# Patient Record
Sex: Female | Born: 1975 | Race: White | Hispanic: No | Marital: Married | Smoking: Never smoker
Health system: Southern US, Community
[De-identification: ages and names within clinical notes are randomized; demographics above are authoritative.]

## PROBLEM LIST (undated history)

## (undated) DIAGNOSIS — O09529 Supervision of elderly multigravida, unspecified trimester: Secondary | ICD-10-CM

## (undated) HISTORY — PX: FEMORAL HERNIA REPAIR: SUR1179

## (undated) HISTORY — DX: Supervision of elderly multigravida, unspecified trimester: O09.529

## (undated) HISTORY — PX: KNEE ARTHROSCOPY: SUR90

## (undated) HISTORY — PX: ORIF FINGER / THUMB FRACTURE: SUR932

---

## 2006-04-03 ENCOUNTER — Ambulatory Visit: Payer: Self-pay | Admitting: Orthopaedic Surgery

## 2007-03-31 ENCOUNTER — Ambulatory Visit: Payer: Self-pay | Admitting: Sports Medicine

## 2008-06-14 ENCOUNTER — Emergency Department: Payer: Self-pay | Admitting: Unknown Physician Specialty

## 2008-11-03 ENCOUNTER — Emergency Department: Payer: Self-pay | Admitting: Internal Medicine

## 2009-02-22 ENCOUNTER — Emergency Department: Payer: Self-pay | Admitting: Emergency Medicine

## 2009-11-07 ENCOUNTER — Ambulatory Visit: Payer: Self-pay | Admitting: General Practice

## 2010-05-14 ENCOUNTER — Emergency Department: Payer: Self-pay | Admitting: Emergency Medicine

## 2010-12-15 ENCOUNTER — Emergency Department: Payer: Self-pay | Admitting: Emergency Medicine

## 2011-05-04 ENCOUNTER — Emergency Department: Payer: Self-pay | Admitting: *Deleted

## 2012-08-20 ENCOUNTER — Ambulatory Visit (INDEPENDENT_AMBULATORY_CARE_PROVIDER_SITE_OTHER): Payer: BC Managed Care – PPO | Admitting: Family Medicine

## 2012-08-20 VITALS — BP 103/64 | Ht 66.0 in | Wt 127.0 lb

## 2012-08-20 DIAGNOSIS — Z1151 Encounter for screening for human papillomavirus (HPV): Secondary | ICD-10-CM

## 2012-08-20 DIAGNOSIS — O09519 Supervision of elderly primigravida, unspecified trimester: Secondary | ICD-10-CM | POA: Insufficient documentation

## 2012-08-20 DIAGNOSIS — O09529 Supervision of elderly multigravida, unspecified trimester: Secondary | ICD-10-CM

## 2012-08-20 DIAGNOSIS — IMO0002 Reserved for concepts with insufficient information to code with codable children: Secondary | ICD-10-CM

## 2012-08-20 DIAGNOSIS — Z3400 Encounter for supervision of normal first pregnancy, unspecified trimester: Secondary | ICD-10-CM

## 2012-08-20 DIAGNOSIS — Z113 Encounter for screening for infections with a predominantly sexual mode of transmission: Secondary | ICD-10-CM

## 2012-08-20 DIAGNOSIS — Z124 Encounter for screening for malignant neoplasm of cervix: Secondary | ICD-10-CM

## 2012-08-20 DIAGNOSIS — Z3401 Encounter for supervision of normal first pregnancy, first trimester: Secondary | ICD-10-CM

## 2012-08-20 MED ORDER — PRENATAL VITAMINS 0.8 MG PO TABS: 1.0000 | ORAL_TABLET | Freq: Every day | ORAL | Status: DC

## 2012-08-20 NOTE — Patient Instructions (Signed)
Pregnancy - First Trimester During sexual intercourse, millions of sperm go into the vagina. Only 1 sperm will penetrate and fertilize the female egg while it is in the Fallopian tube. One week later, the fertilized egg implants into the wall of the uterus. An embryo begins to develop into a baby. At 6 to 8 weeks, the eyes and face are formed and the heartbeat can be seen on ultrasound. At the end of 12 weeks (first trimester), all the baby's organs are formed. Now that you are pregnant, you will want to do everything you can to have a healthy baby. Two of the most important things are to get good prenatal care and follow your caregiver's instructions. Prenatal care is all the medical care you receive before the baby's birth. It is given to prevent, find, and treat problems during the pregnancy and childbirth. PRENATAL EXAMS  During prenatal visits, your weight, blood pressure, and urine are checked. This is done to make sure you are healthy and progressing normally during the pregnancy.  A pregnant woman should gain 25 to 35 pounds during the pregnancy. However, if you are overweight or underweight, your caregiver will advise you regarding your weight.  Your caregiver will ask and answer questions for you.  Blood work, cervical cultures, other necessary tests, and a Pap test are done during your prenatal exams. These tests are done to check on your health and the probable health of your baby. Tests are strongly recommended and done for HIV with your permission. This is the virus that causes AIDS. These tests are done because medicines can be given to help prevent your baby from being born with this infection should you have been infected without knowing it. Blood work is also used to find out your blood type, previous infections, and follow your blood levels (hemoglobin).  Low hemoglobin (anemia) is common during pregnancy. Iron and vitamins are given to help prevent this. Later in the pregnancy,  blood tests for diabetes will be done along with any other tests if any problems develop.  You may need other tests to make sure you and the baby are doing well. CHANGES DURING THE FIRST TRIMESTER  Your body goes through many changes during pregnancy. They vary from person to person. Talk to your caregiver about changes you notice and are concerned about. Changes can include:  Your menstrual period stops.  The egg and sperm carry the genes that determine what you look like. Genes from you and your partner are forming a baby. The female genes determine whether the baby is a boy or a girl.  Your body increases in girth and you may feel bloated.  Feeling sick to your stomach (nauseous) and throwing up (vomiting). If the vomiting is uncontrollable, call your caregiver.  Your breasts will begin to enlarge and become tender.  Your nipples may stick out more and become darker.  The need to urinate more. Painful urination may mean you have a bladder infection.  Tiring easily.  Loss of appetite.  Cravings for certain kinds of food.  At first, you may gain or lose a couple of pounds.  You may have changes in your emotions from day to day (excited to be pregnant or concerned something may go wrong with the pregnancy and baby).  You may have more vivid and strange dreams. HOME CARE INSTRUCTIONS   It is very important to avoid all smoking, alcohol and non-prescribed drugs during your pregnancy. These affect the formation and growth of the baby.   Avoid chemicals while pregnant to ensure the delivery of a healthy infant.  Start your prenatal visits by the 12th week of pregnancy. They are usually scheduled monthly at first, then more often in the last 2 months before delivery. Keep your caregiver's appointments. Follow your caregiver's instructions regarding medicine use, blood and lab tests, exercise, and diet.  During pregnancy, you are providing food for you and your baby. Eat regular,  well-balanced meals. Choose foods such as meat, fish, milk and other low fat dairy products, vegetables, fruits, and whole-grain breads and cereals. Your caregiver will tell you of the ideal weight gain.  You can help morning sickness by keeping soda crackers at the bedside. Eat a couple before arising in the morning. You may want to use the crackers without salt on them.  Eating 4 to 5 small meals rather than 3 large meals a day also may help the nausea and vomiting.  Drinking liquids between meals instead of during meals also seems to help nausea and vomiting.  A physical sexual relationship may be continued throughout pregnancy if there are no other problems. Problems may be early (premature) leaking of amniotic fluid from the membranes, vaginal bleeding, or belly (abdominal) pain.  Exercise regularly if there are no restrictions. Check with your caregiver or physical therapist if you are unsure of the safety of some of your exercises. Greater weight gain will occur in the last 2 trimesters of pregnancy. Exercising will help:  Control your weight.  Keep you in shape.  Prepare you for labor and delivery.  Help you lose your pregnancy weight after you deliver your baby.  Wear a good support or jogging bra for breast tenderness during pregnancy. This may help if worn during sleep too.  Ask when prenatal classes are available. Begin classes when they are offered.  Do not use hot tubs, steam rooms, or saunas.  Wear your seat belt when driving. This protects you and your baby if you are in an accident.  Avoid raw meat, uncooked cheese, cat litter boxes, and soil used by cats throughout the pregnancy. These carry germs that can cause birth defects in the baby.  The first trimester is a good time to visit your dentist for your dental health. Getting your teeth cleaned is okay. Use a softer toothbrush and brush gently during pregnancy.  Ask for help if you have financial, counseling, or  nutritional needs during pregnancy. Your caregiver will be able to offer counseling for these needs as well as refer you for other special needs.  Do not take any medicines or herbs unless told by your caregiver.  Inform your caregiver if there is any mental or physical domestic violence.  Make a list of emergency phone numbers of family, friends, hospital, and police and fire departments.  Write down your questions. Take them to your prenatal visit.  Do not douche.  Do not cross your legs.  If you have to stand for long periods of time, rotate you feet or take small steps in a circle.  You may have more vaginal secretions that may require a sanitary pad. Do not use tampons or scented sanitary pads. MEDICINES AND DRUG USE IN PREGNANCY  Take prenatal vitamins as directed. The vitamin should contain 1 milligram of folic acid. Keep all vitamins out of reach of children. Only a couple vitamins or tablets containing iron may be fatal to a baby or young child when ingested.  Avoid use of all medicines, including herbs, over-the-counter medicines, not   prescribed or suggested by your caregiver. Only take over-the-counter or prescription medicines for pain, discomfort, or fever as directed by your caregiver. Do not use aspirin, ibuprofen, or naproxen unless directed by your caregiver.  Let your caregiver also know about herbs you may be using.  Alcohol is related to a number of birth defects. This includes fetal alcohol syndrome. All alcohol, in any form, should be avoided completely. Smoking will cause low birth rate and premature babies.  Street or illegal drugs are very harmful to the baby. They are absolutely forbidden. A baby born to an addicted mother will be addicted at birth. The baby will go through the same withdrawal an adult does.  Let your caregiver know about any medicines that you have to take and for what reason you take them. SEEK MEDICAL CARE IF:  You have any concerns or  worries during your pregnancy. It is better to call with your questions if you feel they cannot wait, rather than worry about them. SEEK IMMEDIATE MEDICAL CARE IF:   An unexplained oral temperature above 102 F (38.9 C) develops, or as your caregiver suggests.  You have leaking of fluid from the vagina (birth canal). If leaking membranes are suspected, take your temperature and inform your caregiver of this when you call.  There is vaginal spotting or bleeding. Notify your caregiver of the amount and how many pads are used.  You develop a bad smelling vaginal discharge with a change in the color.  You continue to feel sick to your stomach (nauseated) and have no relief from remedies suggested. You vomit blood or coffee ground-like materials.  You lose more than 2 pounds of weight in 1 week.  You gain more than 2 pounds of weight in 1 week and you notice swelling of your face, hands, feet, or legs.  You gain 5 pounds or more in 1 week (even if you do not have swelling of your hands, face, legs, or feet).  You get exposed to German measles and have never had them.  You are exposed to fifth disease or chickenpox.  You develop belly (abdominal) pain. Round ligament discomfort is a common non-cancerous (benign) cause of abdominal pain in pregnancy. Your caregiver still must evaluate this.  You develop headache, fever, diarrhea, pain with urination, or shortness of breath.  You fall or are in a car accident or have any kind of trauma.  There is mental or physical violence in your home. Document Released: 01/15/2001 Document Revised: 10/16/2011 Document Reviewed: 07/19/2008 [employer] Patient Information 2014 [employer].  Breastfeeding A change in hormones during your pregnancy causes growth of your breast tissue and an increase in number and size of milk ducts. The hormone prolactin allows proteins, sugars, and fats from your blood supply to make breast milk in your milk-producing  glands. The hormone progesterone prevents breast milk from being released before the birth of your baby. After the birth of your baby, your progesterone level decreases allowing breast milk to be released. Thoughts of your baby, as well as his or her sucking or crying, can stimulate the release of milk from the milk-producing glands. Deciding to breastfeed (nurse) is one of the best choices you can make for you and your baby. The information that follows gives a brief review of the benefits, as well as other important skills to know about breastfeeding. BENEFITS OF BREASTFEEDING For your baby  The first milk (colostrum) helps your baby's digestive system function better.   There are antibodies in   your milk that help your baby fight off infections.   Your baby has a lower incidence of asthma, allergies, and sudden infant death syndrome (SIDS).   The nutrients in breast milk are better for your baby than infant formulas.  Breast milk improves your baby's brain development.   Your baby will have less gas, colic, and constipation.  Your baby is less likely to develop other conditions, such as childhood obesity, asthma, or diabetes mellitus. For you  Breastfeeding helps develop a very special bond between you and your baby.   Breastfeeding is convenient, always available at the correct temperature, and costs nothing.   Breastfeeding helps to burn calories and helps you lose the weight gained during pregnancy.   Breastfeeding makes your uterus contract back down to normal size faster and slows bleeding following delivery.   Breastfeeding mothers have a lower risk of developing osteoporosis or breast or ovarian cancer later in life.  BREASTFEEDING FREQUENCY  A healthy, full-term baby may breastfeed as often as every hour or space his or her feedings to every 3 hours. Breastfeeding frequency will vary from baby to baby.   Newborns should be fed no less than every 2 3 hours during  the day and every 4 5 hours during the night. You should breastfeed a minimum of 8 feedings in a 24 hour period.  Awaken your baby to breastfeed if it has been 3 4 hours since the last feeding.  Breastfeed when you feel the need to reduce the fullness of your breasts or when your newborn shows signs of hunger. Signs that your baby may be hungry include:  Increased alertness or activity.  Stretching.  Movement of the head from side to side.  Movement of the head and opening of the mouth when the corner of the mouth or cheek is stroked (rooting).  Increased sucking sounds, smacking lips, cooing, sighing, or squeaking.  Hand-to-mouth movements.  Increased sucking of fingers or hands.  Fussing.  Intermittent crying.  Signs of extreme hunger will require calming and consoling before you try to feed your baby. Signs of extreme hunger may include:  Restlessness.  A loud, strong cry.  Screaming.  Frequent feeding will help you make more milk and will help prevent problems, such as sore nipples and engorgement of the breasts.  BREASTFEEDING   Whether lying down or sitting, be sure that the baby's abdomen is facing your abdomen.   Support your breast with 4 fingers under your breast and your thumb above your nipple. Make sure your fingers are well away from your nipple and your baby's mouth.   Stroke your baby's lips gently with your finger or nipple.   When your baby's mouth is open wide enough, place all of your nipple and as much of the colored area around your nipple (areola) as possible into your baby's mouth.  More areola should be visible above his or her upper lip than below his or her lower lip.  Your baby's tongue should be between his or her lower gum and your breast.  Ensure that your baby's mouth is correctly positioned around the nipple (latched). Your baby's lips should create a seal on your breast.  Signs that your baby has effectively latched onto your  nipple include:  Tugging or sucking without pain.  Swallowing heard between sucks.  Absent click or smacking sound.  Muscle movement above and in front of his or her ears with sucking.  Your baby must suck about 2 3 minutes in   order to get your milk. Allow your baby to feed on each breast as long as he or she wants. Nurse your baby until he or she unlatches or falls asleep at the first breast, then offer the second breast.  Signs that your baby is full and satisfied include:  A gradual decrease in the number of sucks or complete cessation of sucking.  Falling asleep.  Extension or relaxation of his or her body.  Retention of a small amount of milk in his or her mouth.  Letting go of your breast by himself or herself.  Signs of effective breastfeeding in you include:  Breasts that have increased firmness, weight, and size prior to feeding.  Breasts that are softer after nursing.  Increased milk volume, as well as a change in milk consistency and color by the 5th day of breastfeeding.  Breast fullness relieved by breastfeeding.  Nipples are not sore, cracked, or bleeding.  If needed, break the suction by putting your finger into the corner of your baby's mouth and sliding your finger between his or her gums. Then, remove your breast from his or her mouth.  It is common for babies to spit up a small amount after a feeding.  Babies often swallow air during feeding. This can make babies fussy. Burping your baby between breasts can help with this.  Vitamin D supplements are recommended for babies who get only breast milk.  Avoid using a pacifier during your baby's first 4 6 weeks.  Avoid supplemental feedings of water, formula, or juice in place of breastfeeding. Breast milk is all the food your baby needs. It is not necessary for your baby to have water or formula. Your breasts will make more milk if supplemental feedings are avoided during the early weeks. HOW TO TELL  WHETHER YOUR BABY IS GETTING ENOUGH BREAST MILK Wondering whether or not your baby is getting enough milk is a common concern among mothers. You can be assured that your baby is getting enough milk if:   Your baby is actively sucking and you hear swallowing.   Your baby seems relaxed and satisfied after a feeding.   Your baby nurses at least 8 12 times in a 24 hour time period.  During the first 3 5 days of age:  Your baby is wetting at least 3 5 diapers in a 24 hour period. The urine should be clear and pale yellow.  Your baby is having at least 3 4 stools in a 24 hour period. The stool should be soft and yellow.  At 5 7 days of age, your baby is having at least 3 6 stools in a 24 hour period. The stool should be seedy and yellow by 5 days of age.  Your baby has a weight loss less than 7 10% during the first 3 days of age.  Your baby does not lose weight after 3 7 days of age.  Your baby gains 4 7 ounces each week after he or she is 4 days of age.  Your baby gains weight by 5 days of age and is back to birth weight within 2 weeks. ENGORGEMENT In the first week after your baby is born, you may experience extremely full breasts (engorgement). When engorged, your breasts may feel heavy, warm, or tender to the touch. Engorgement peaks within 24 48 hours after delivery of your baby.  Engorgement may be reduced by:  Continuing to breastfeed.  Increasing the frequency of breastfeeding.  Taking warm showers or   applying warm, moist heat to your breasts just before each feeding. This increases circulation and helps the milk flow.   Gently massaging your breast before and during the feedings. With your fingertips, massage from your chest wall towards your nipple in a circular motion.   Ensuring that your baby empties at least one breast at every feeding. It also helps to start the next feeding on the opposite breast.   Expressing breast milk by hand or by using a breast pump to  empty the breasts if your baby is sleepy, or not nursing well. You may also want to express milk if you are returning to work oryou feel you are getting engorged.  Ensuring your baby is latched on and positioned properly while breastfeeding. If you follow these suggestions, your engorgement should improve in 24 48 hours. If you are still experiencing difficulty, call your lactation consultant or caregiver.  CARING FOR YOURSELF Take care of your breasts.  Bathe or shower daily.   Avoid using soap on your nipples.   Wear a supportive bra. Avoid wearing underwire style bras.  Air dry your nipples for a 3 4minutes after each feeding.   Use only cotton bra pads to absorb breast milk leakage. Leaking of breast milk between feedings is normal.   Use only pure lanolin on your nipples after nursing. You do not need to wash it off before feeding your baby again. Another option is to express a few drops of breast milk and gently massage that milk into your nipples.  Continue breast self-awareness checks. Take care of yourself.  Eat healthy foods. Alternate 3 meals with 3 snacks.  Avoid foods that you notice affect your baby in a bad way.  Drink milk, fruit juice, and water to satisfy your thirst (about 8 glasses a day).   Rest often, relax, and take your prenatal vitamins to prevent fatigue, stress, and anemia.  Avoid chewing and smoking tobacco.  Avoid alcohol and drug use.  Take over-the-counter and prescribed medicine only as directed by your caregiver or pharmacist. You should always check with your caregiver or pharmacist before taking any new medicine, vitamin, or herbal supplement.  Know that pregnancy is possible while breastfeeding. If desired, talk to your caregiver about family planning and safe birth control methods that may be used while breastfeeding. SEEK MEDICAL CARE IF:   You feel like you want to stop breastfeeding or have become frustrated with  breastfeeding.  You have painful breasts or nipples.  Your nipples are cracked or bleeding.  Your breasts are red, tender, or warm.  You have a swollen area on either breast.  You have a fever or chills.  You have nausea or vomiting.  You have drainage from your nipples.  Your breasts do not become full before feedings by the 5th day after delivery.  You feel sad and depressed.  Your baby is too sleepy to eat well.  Your baby is having trouble sleeping.   Your baby is wetting less than 3 diapers in a 24 hour period.  Your baby has less than 3 stools in a 24 hour period.  Your baby's skin or the white part of his or her eyes becomes more yellow.   Your baby is not gaining weight by 5 days of age. MAKE SURE YOU:   Understand these instructions.  Will watch your condition.  Will get help right away if you are not doing well or get worse. Document Released: 01/21/2005 Document Revised: 10/16/2011 Document Reviewed:   08/28/2011 [employer] Patient Information 2014 [employer].  

## 2012-08-20 NOTE — Progress Notes (Signed)
   Subjective:    Margaret Rosario is a G1P0 [redacted]w[redacted]d being seen today for her first obstetrical visit.  Her obstetrical history is significant for AMA. Patient does intend to breast feed. Pregnancy history fully reviewed.  Patient reports fatigue and nausea.  Filed Vitals:   08/20/12 1017 08/20/12 1020  BP: 103/64   Height:  5\' 6"  (1.676 m)  Weight: 127 lb (57.607 kg)     HISTORY: OB History   Grav Para Term Preterm Abortions TAB SAB Ect Mult Living   1              # Outc Date GA Lbr Len/2nd Wgt Sex Del Anes PTL Lv   1 CUR              No past medical history on file. Past Surgical History  Procedure Laterality Date  . Knee arthroscopy Left   . Femoral hernia repair    . Orif finger / thumb fracture     Family History  Problem Relation Age of Onset  . Graves' disease Brother   . Cancer Maternal Grandfather     lung     Exam    Uterus:     Pelvic Exam:    Perineum: Normal Perineum   Vulva: Bartholin's, Urethra, Skene's normal   Vagina:  normal mucosa, normal discharge   Cervix: no cervical motion tenderness, no lesions and nulliparous appearance   Adnexa: normal adnexa   Bony Pelvis: average  System: Breast:  normal appearance, no masses or tenderness   Skin: normal coloration and turgor, no rashes    Neurologic: oriented   Extremities: normal strength, tone, and muscle mass   HEENT sclera clear, anicteric   Mouth/Teeth mucous membranes moist, pharynx normal without lesions   Neck supple   Cardiovascular: regular rate and rhythm, no murmurs or gallops   Respiratory:  appears well, vitals normal, no respiratory distress, acyanotic, normal RR, ear and throat exam is normal, neck free of mass or lymphadenopathy, chest clear, no wheezing, crepitations, rhonchi, normal symmetric air entry   Abdomen: soft, non-tender; bowel sounds normal; no masses,  no organomegaly      Assessment:    Pregnancy: G1P0 Patient Active Problem List   Diagnosis Date Noted  .  Supervision of normal first pregnancy 08/20/2012  . Advanced maternal age in pregnancy in first trimester 08/20/2012        Plan:     Initial labs drawn. Prenatal vitamins. Problem list reviewed and updated. Genetic Screening discussed First Screen: undecided for genetics referral for AMA  Ultrasound discussed; fetal survey: discussed.  Follow up in 4 weeks. New ob labs and pap done today.     Winn Muehl S 08/20/2012

## 2012-08-20 NOTE — Progress Notes (Signed)
P - 63 - Pt states breast have been more tender than usual, she has experienced nausea and fatigue

## 2012-08-21 LAB — OBSTETRIC PANEL
Antibody Screen: NEGATIVE
Basophils Absolute: 0 10*3/uL (ref 0.0–0.1)
Basophils Relative: 0 % (ref 0–1)
Eosinophils Absolute: 0.1 10*3/uL (ref 0.0–0.7)
Hepatitis B Surface Ag: NEGATIVE
MCH: 30 pg (ref 26.0–34.0)
MCHC: 32.9 g/dL (ref 30.0–36.0)
Monocytes Absolute: 0.6 10*3/uL (ref 0.1–1.0)
Neutro Abs: 4.3 10*3/uL (ref 1.7–7.7)
Neutrophils Relative %: 68 % (ref 43–77)
RDW: 12.5 % (ref 11.5–15.5)

## 2012-08-22 LAB — CULTURE, OB URINE: Colony Count: NO GROWTH

## 2012-08-24 LAB — HEMOGLOBINOPATHY EVALUATION
Hgb A2 Quant: 2.6 % (ref 2.2–3.2)
Hgb A: 97.4 % (ref 96.8–97.8)
Hgb F Quant: 0 % (ref 0.0–2.0)

## 2012-08-27 ENCOUNTER — Telehealth: Payer: Self-pay

## 2012-08-27 NOTE — Telephone Encounter (Signed)
Patient is having bad morning sickness I called in Zofran 4mg  tab #30 with 3 refills to her pharmacy [facility] on [address], please addendum this to her meds thanks Dr. Shawnie Pons.

## 2012-08-31 MED ORDER — ONDANSETRON HCL 4 MG PO TABS: 4.0000 mg | ORAL_TABLET | Freq: Three times a day (TID) | ORAL | Status: DC | PRN

## 2012-09-17 ENCOUNTER — Ambulatory Visit (INDEPENDENT_AMBULATORY_CARE_PROVIDER_SITE_OTHER): Payer: BC Managed Care – PPO | Admitting: Obstetrics & Gynecology

## 2012-09-17 VITALS — BP 107/71 | Wt 124.0 lb

## 2012-09-17 DIAGNOSIS — O09529 Supervision of elderly multigravida, unspecified trimester: Secondary | ICD-10-CM

## 2012-09-17 DIAGNOSIS — IMO0002 Reserved for concepts with insufficient information to code with codable children: Secondary | ICD-10-CM

## 2012-09-17 DIAGNOSIS — Z3480 Encounter for supervision of other normal pregnancy, unspecified trimester: Secondary | ICD-10-CM

## 2012-09-17 NOTE — Progress Notes (Signed)
Still undecided about fetal screening, will decide and let us know. Nausea is better now on Zofran.  No other complaints or concerns.  Routine obstetric precautions reviewed.

## 2012-09-17 NOTE — Progress Notes (Signed)
P=79 

## 2012-09-17 NOTE — Patient Instructions (Signed)
[facility] noninvasive prenatal testing  First trimeter screening and nuchal translucency Quad screen in second trimester  Pregnancy - First Trimester During sexual intercourse, millions of sperm go into the vagina. Only 1 sperm will penetrate and fertilize the female egg while it is in the Fallopian tube. One week later, the fertilized egg implants into the wall of the uterus. An embryo begins to develop into a baby. At 6 to 8 weeks, the eyes and face are formed and the heartbeat can be seen on ultrasound. At the end of 12 weeks (first trimester), all the baby's organs are formed. Now that you are pregnant, you will want to do everything you can to have a healthy baby. Two of the most important things are to get good prenatal care and follow your caregiver's instructions. Prenatal care is all the medical care you receive before the baby's birth. It is given to prevent, find, and treat problems during the pregnancy and childbirth. PRENATAL EXAMS  During prenatal visits, your weight, blood pressure, and urine are checked. This is done to make sure you are healthy and progressing normally during the pregnancy.  A pregnant woman should gain 25 to 35 pounds during the pregnancy. However, if you are overweight or underweight, your caregiver will advise you regarding your weight.  Your caregiver will ask and answer questions for you.  Blood work, cervical cultures, other necessary tests, and a Pap test are done during your prenatal exams. These tests are done to check on your health and the probable health of your baby. Tests are strongly recommended and done for HIV with your permission. This is the virus that causes AIDS. These tests are done because medicines can be given to help prevent your baby from being born with this infection should you have been infected without knowing it. Blood work is also used to find out your blood type, previous infections, and follow your blood levels (hemoglobin).  Low  hemoglobin (anemia) is common during pregnancy. Iron and vitamins are given to help prevent this. Later in the pregnancy, blood tests for diabetes will be done along with any other tests if any problems develop.  You may need other tests to make sure you and the baby are doing well. CHANGES DURING THE FIRST TRIMESTER  Your body goes through many changes during pregnancy. They vary from person to person. Talk to your caregiver about changes you notice and are concerned about. Changes can include:  Your menstrual period stops.  The egg and sperm carry the genes that determine what you look like. Genes from you and your partner are forming a baby. The female genes determine whether the baby is a boy or a girl.  Your body increases in girth and you may feel bloated.  Feeling sick to your stomach (nauseous) and throwing up (vomiting). If the vomiting is uncontrollable, call your caregiver.  Your breasts will begin to enlarge and become tender.  Your nipples may stick out more and become darker.  The need to urinate more. Painful urination may mean you have a bladder infection.  Tiring easily.  Loss of appetite.  Cravings for certain kinds of food.  At first, you may gain or lose a couple of pounds.  You may have changes in your emotions from day to day (excited to be pregnant or concerned something may go wrong with the pregnancy and baby).  You may have more vivid and strange dreams. HOME CARE INSTRUCTIONS   It is very important to avoid all  smoking, alcohol and non-prescribed drugs during your pregnancy. These affect the formation and growth of the baby. Avoid chemicals while pregnant to ensure the delivery of a healthy infant.  Start your prenatal visits by the 12th week of pregnancy. They are usually scheduled monthly at first, then more often in the last 2 months before delivery. Keep your caregiver's appointments. Follow your caregiver's instructions regarding medicine use, blood  and lab tests, exercise, and diet.  During pregnancy, you are providing food for you and your baby. Eat regular, well-balanced meals. Choose foods such as meat, fish, milk and other low fat dairy products, vegetables, fruits, and whole-grain breads and cereals. Your caregiver will tell you of the ideal weight gain.  You can help morning sickness by keeping soda crackers at the bedside. Eat a couple before arising in the morning. You may want to use the crackers without salt on them.  Eating 4 to 5 small meals rather than 3 large meals a day also may help the nausea and vomiting.  Drinking liquids between meals instead of during meals also seems to help nausea and vomiting.  A physical sexual relationship may be continued throughout pregnancy if there are no other problems. Problems may be early (premature) leaking of amniotic fluid from the membranes, vaginal bleeding, or belly (abdominal) pain.  Exercise regularly if there are no restrictions. Check with your caregiver or physical therapist if you are unsure of the safety of some of your exercises. Greater weight gain will occur in the last 2 trimesters of pregnancy. Exercising will help:  Control your weight.  Keep you in shape.  Prepare you for labor and delivery.  Help you lose your pregnancy weight after you deliver your baby.  Wear a good support or jogging bra for breast tenderness during pregnancy. This may help if worn during sleep too.  Ask when prenatal classes are available. Begin classes when they are offered.  Do not use hot tubs, steam rooms, or saunas.  Wear your seat belt when driving. This protects you and your baby if you are in an accident.  Avoid raw meat, uncooked cheese, cat litter boxes, and soil used by cats throughout the pregnancy. These carry germs that can cause birth defects in the baby.  The first trimester is a good time to visit your dentist for your dental health. Getting your teeth cleaned is okay.  Use a softer toothbrush and brush gently during pregnancy.  Ask for help if you have financial, counseling, or nutritional needs during pregnancy. Your caregiver will be able to offer counseling for these needs as well as refer you for other special needs.  Do not take any medicines or herbs unless told by your caregiver.  Inform your caregiver if there is any mental or physical domestic violence.  Make a list of emergency phone numbers of family, friends, hospital, and police and fire departments.  Write down your questions. Take them to your prenatal visit.  Do not douche.  Do not cross your legs.  If you have to stand for long periods of time, rotate you feet or take small steps in a circle.  You may have more vaginal secretions that may require a sanitary pad. Do not use tampons or scented sanitary pads. MEDICINES AND DRUG USE IN PREGNANCY  Take prenatal vitamins as directed. The vitamin should contain 1 milligram of folic acid. Keep all vitamins out of reach of children. Only a couple vitamins or tablets containing iron may be fatal to a  baby or young child when ingested.  Avoid use of all medicines, including herbs, over-the-counter medicines, not prescribed or suggested by your caregiver. Only take over-the-counter or prescription medicines for pain, discomfort, or fever as directed by your caregiver. Do not use aspirin, ibuprofen, or naproxen unless directed by your caregiver.  Let your caregiver also know about herbs you may be using.  Alcohol is related to a number of birth defects. This includes fetal alcohol syndrome. All alcohol, in any form, should be avoided completely. Smoking will cause low birth rate and premature babies.  Street or illegal drugs are very harmful to the baby. They are absolutely forbidden. A baby born to an addicted mother will be addicted at birth. The baby will go through the same withdrawal an adult does.  Let your caregiver know about any  medicines that you have to take and for what reason you take them. SEEK MEDICAL CARE IF:  You have any concerns or worries during your pregnancy. It is better to call with your questions if you feel they cannot wait, rather than worry about them. SEEK IMMEDIATE MEDICAL CARE IF:   An unexplained oral temperature above 102 F (38.9 C) develops, or as your caregiver suggests.  You have leaking of fluid from the vagina (birth canal). If leaking membranes are suspected, take your temperature and inform your caregiver of this when you call.  There is vaginal spotting or bleeding. Notify your caregiver of the amount and how many pads are used.  You develop a bad smelling vaginal discharge with a change in the color.  You continue to feel sick to your stomach (nauseated) and have no relief from remedies suggested. You vomit blood or coffee ground-like materials.  You lose more than 2 pounds of weight in 1 week.  You gain more than 2 pounds of weight in 1 week and you notice swelling of your face, hands, feet, or legs.  You gain 5 pounds or more in 1 week (even if you do not have swelling of your hands, face, legs, or feet).  You get exposed to [place] measles and have never had them.  You are exposed to fifth disease or chickenpox.  You develop belly (abdominal) pain. Round ligament discomfort is a common non-cancerous (benign) cause of abdominal pain in pregnancy. Your caregiver still must evaluate this.  You develop headache, fever, diarrhea, pain with urination, or shortness of breath.  You fall or are in a car accident or have any kind of trauma.  There is mental or physical violence in your home. Document Released: 01/15/2001 Document Revised: 10/16/2011 Document Reviewed: 07/19/2008 [facility] Patient Information 2014 [place], [place].

## 2012-09-28 ENCOUNTER — Telehealth: Payer: Self-pay | Admitting: *Deleted

## 2012-09-28 MED ORDER — PROMETHAZINE HCL 25 MG PO TABS: 25.0000 mg | ORAL_TABLET | Freq: Four times a day (QID) | ORAL | Status: DC | PRN

## 2012-09-28 NOTE — Telephone Encounter (Signed)
Would like to try phenergan instead of the zofran due to zofran is causing constipation issues.

## 2012-10-15 ENCOUNTER — Ambulatory Visit (INDEPENDENT_AMBULATORY_CARE_PROVIDER_SITE_OTHER): Payer: BC Managed Care – PPO | Admitting: Obstetrics and Gynecology

## 2012-10-15 ENCOUNTER — Encounter: Payer: Self-pay | Admitting: Obstetrics and Gynecology

## 2012-10-15 VITALS — BP 110/71 | Wt 127.0 lb

## 2012-10-15 DIAGNOSIS — IMO0002 Reserved for concepts with insufficient information to code with codable children: Secondary | ICD-10-CM

## 2012-10-15 DIAGNOSIS — Z3480 Encounter for supervision of other normal pregnancy, unspecified trimester: Secondary | ICD-10-CM

## 2012-10-15 DIAGNOSIS — O09529 Supervision of elderly multigravida, unspecified trimester: Secondary | ICD-10-CM

## 2012-10-15 NOTE — Progress Notes (Signed)
[id number] 

## 2012-10-15 NOTE — Progress Notes (Signed)
Patient doing well, feeling better. Decided on NIPS. Anatomy ultrasound scheduled for week of 10/13

## 2012-10-16 NOTE — Addendum Note (Signed)
Addended by: Barbara Cower on: 10/16/2012 11:16 AM   Modules accepted: Orders

## 2012-10-29 ENCOUNTER — Telehealth: Payer: Self-pay | Admitting: *Deleted

## 2012-10-29 NOTE — Telephone Encounter (Signed)
Spoke to patient and she was given results of her [facility] testing.  All testing came back "Low Risk"  Patient and her husband do not wish to know the sex of the baby.

## 2012-11-11 ENCOUNTER — Ambulatory Visit (INDEPENDENT_AMBULATORY_CARE_PROVIDER_SITE_OTHER): Payer: BC Managed Care – PPO | Admitting: Family Medicine

## 2012-11-11 ENCOUNTER — Encounter: Payer: Self-pay | Admitting: Family Medicine

## 2012-11-11 VITALS — BP 112/64 | Wt 132.0 lb

## 2012-11-11 DIAGNOSIS — O09529 Supervision of elderly multigravida, unspecified trimester: Secondary | ICD-10-CM

## 2012-11-11 DIAGNOSIS — Z3480 Encounter for supervision of other normal pregnancy, unspecified trimester: Secondary | ICD-10-CM

## 2012-11-11 DIAGNOSIS — IMO0002 Reserved for concepts with insufficient information to code with codable children: Secondary | ICD-10-CM

## 2012-11-11 NOTE — Patient Instructions (Signed)
Pregnancy - Second Trimester The second trimester of pregnancy (3 to 6 months) is a period of rapid growth for you and your baby. At the end of the sixth month, your baby is about 9 inches long and weighs 1 1/2 pounds. You will begin to feel the baby move between 18 and 20 weeks of the pregnancy. This is called quickening. Weight gain is faster. A clear fluid (colostrum) may leak out of your breasts. You may feel small contractions of the womb (uterus). This is known as false labor or Braxton-Hicks contractions. This is like a practice for labor when the baby is ready to be born. Usually, the problems with morning sickness have usually passed by the end of your first trimester. Some women develop small dark blotches (called cholasma, mask of pregnancy) on their face that usually goes away after the baby is born. Exposure to the sun makes the blotches worse. Acne may also develop in some pregnant women and pregnant women who have acne, may find that it goes away. PRENATAL EXAMS  Blood work may continue to be done during prenatal exams. These tests are done to check on your health and the probable health of your baby. Blood work is used to follow your blood levels (hemoglobin). Anemia (low hemoglobin) is common during pregnancy. Iron and vitamins are given to help prevent this. You will also be checked for diabetes between 24 and 28 weeks of the pregnancy. Some of the previous blood tests may be repeated.  The size of the uterus is measured during each visit. This is to make sure that the baby is continuing to grow properly according to the dates of the pregnancy.  Your blood pressure is checked every prenatal visit. This is to make sure you are not getting toxemia.  Your urine is checked to make sure you do not have an infection, diabetes or protein in the urine.  Your weight is checked often to make sure gains are happening at the suggested rate. This is to ensure that both you and your baby are  growing normally.  Sometimes, an ultrasound is performed to confirm the proper growth and development of the baby. This is a test which bounces harmless sound waves off the baby so your caregiver can more accurately determine due dates. Sometimes, a test is done on the amniotic fluid surrounding the baby. This test is called an amniocentesis. The amniotic fluid is obtained by sticking a needle into the belly (abdomen). This is done to check the chromosomes in instances where there is a concern about possible genetic problems with the baby. It is also sometimes done near the end of pregnancy if an early delivery is required. In this case, it is done to help make sure the baby's lungs are mature enough for the baby to live outside of the womb. CHANGES OCCURING IN THE SECOND TRIMESTER OF PREGNANCY Your body goes through many changes during pregnancy. They vary from person to person. Talk to your caregiver about changes you notice that you are concerned about.  During the second trimester, you will likely have an increase in your appetite. It is normal to have cravings for certain foods. This varies from person to person and pregnancy to pregnancy.  Your lower abdomen will begin to bulge.  You may have to urinate more often because the uterus and baby are pressing on your bladder. It is also common to get more bladder infections during pregnancy. You can help this by drinking lots of fluids   and emptying your bladder before and after intercourse.  You may begin to get stretch marks on your hips, abdomen, and breasts. These are normal changes in the body during pregnancy. There are no exercises or medicines to take that prevent this change.  You may begin to develop swollen and bulging veins (varicose veins) in your legs. Wearing support hose, elevating your feet for 15 minutes, 3 to 4 times a day and limiting salt in your diet helps lessen the problem.  Heartburn may develop as the uterus grows and  pushes up against the stomach. Antacids recommended by your caregiver helps with this problem. Also, eating smaller meals 4 to 5 times a day helps.  Constipation can be treated with a stool softener or adding bulk to your diet. Drinking lots of fluids, and eating vegetables, fruits, and whole grains are helpful.  Exercising is also helpful. If you have been very active up until your pregnancy, most of these activities can be continued during your pregnancy. If you have been less active, it is helpful to start an exercise program such as walking.  Hemorrhoids may develop at the end of the second trimester. Warm sitz baths and hemorrhoid cream recommended by your caregiver helps hemorrhoid problems.  Backaches may develop during this time of your pregnancy. Avoid heavy lifting, wear low heal shoes, and practice good posture to help with backache problems.  Some pregnant women develop tingling and numbness of their hand and fingers because of swelling and tightening of ligaments in the wrist (carpel tunnel syndrome). This goes away after the baby is born.  As your breasts enlarge, you may have to get a bigger bra. Get a comfortable, cotton, support bra. Do not get a nursing bra until the last month of the pregnancy if you will be nursing the baby.  You may get a dark line from your belly button to the pubic area called the linea nigra.  You may develop rosy cheeks because of increase blood flow to the face.  You may develop spider looking lines of the face, neck, arms, and chest. These go away after the baby is born. HOME CARE INSTRUCTIONS   It is extremely important to avoid all smoking, herbs, alcohol, and unprescribed drugs during your pregnancy. These chemicals affect the formation and growth of the baby. Avoid these chemicals throughout the pregnancy to ensure the delivery of a healthy infant.  Most of your home care instructions are the same as suggested for the first trimester of your  pregnancy. Keep your caregiver's appointments. Follow your caregiver's instructions regarding medicine use, exercise, and diet.  During pregnancy, you are providing food for you and your baby. Continue to eat regular, well-balanced meals. Choose foods such as meat, fish, milk and other low fat dairy products, vegetables, fruits, and whole-grain breads and cereals. Your caregiver will tell you of the ideal weight gain.  A physical sexual relationship may be continued up until near the end of pregnancy if there are no other problems. Problems could include early (premature) leaking of amniotic fluid from the membranes, vaginal bleeding, abdominal pain, or other medical or pregnancy problems.  Exercise regularly if there are no restrictions. Check with your caregiver if you are unsure of the safety of some of your exercises. The greatest weight gain will occur in the last 2 trimesters of pregnancy. Exercise will help you:  Control your weight.  Get you in shape for labor and delivery.  Lose weight after you have the baby.  Wear   a good support or jogging bra for breast tenderness during pregnancy. This may help if worn during sleep. Pads or tissues may be used in the bra if you are leaking colostrum.  Do not use hot tubs, steam rooms or saunas throughout the pregnancy.  Wear your seat belt at all times when driving. This protects you and your baby if you are in an accident.  Avoid raw meat, uncooked cheese, cat litter boxes, and soil used by cats. These carry germs that can cause birth defects in the baby.  The second trimester is also a good time to visit your dentist for your dental health if this has not been done yet. Getting your teeth cleaned is okay. Use a soft toothbrush. Brush gently during pregnancy.  It is easier to leak urine during pregnancy. Tightening up and strengthening the pelvic muscles will help with this problem. Practice stopping your urination while you are going to the  bathroom. These are the same muscles you need to strengthen. It is also the muscles you would use as if you were trying to stop from passing gas. You can practice tightening these muscles up 10 times a set and repeating this about 3 times per day. Once you know what muscles to tighten up, do not perform these exercises during urination. It is more likely to contribute to an infection by backing up the urine.  Ask for help if you have financial, counseling, or nutritional needs during pregnancy. Your caregiver will be able to offer counseling for these needs as well as refer you for other special needs.  Your skin may become oily. If so, wash your face with mild soap, use non-greasy moisturizer and oil or cream based makeup. MEDICINES AND DRUG USE IN PREGNANCY  Take prenatal vitamins as directed. The vitamin should contain 1 milligram of folic acid. Keep all vitamins out of reach of children. Only a couple vitamins or tablets containing iron may be fatal to a baby or young child when ingested.  Avoid use of all medicines, including herbs, over-the-counter medicines, not prescribed or suggested by your caregiver. Only take over-the-counter or prescription medicines for pain, discomfort, or fever as directed by your caregiver. Do not use aspirin.  Let your caregiver also know about herbs you may be using.  Alcohol is related to a number of birth defects. This includes fetal alcohol syndrome. All alcohol, in any form, should be avoided completely. Smoking will cause low birth rate and premature babies.  Street or illegal drugs are very harmful to the baby. They are absolutely forbidden. A baby born to an addicted mother will be addicted at birth. The baby will go through the same withdrawal an adult does. SEEK MEDICAL CARE IF:  You have any concerns or worries during your pregnancy. It is better to call with your questions if you feel they cannot wait, rather than worry about them. SEEK IMMEDIATE  MEDICAL CARE IF:   An unexplained oral temperature above 102 F (38.9 C) develops, or as your caregiver suggests.  You have leaking of fluid from the vagina (birth canal). If leaking membranes are suspected, take your temperature and tell your caregiver of this when you call.  There is vaginal spotting, bleeding, or passing clots. Tell your caregiver of the amount and how many pads are used. Light spotting in pregnancy is common, especially following intercourse.  You develop a bad smelling vaginal discharge with a change in the color from clear to white.  You continue to feel   sick to your stomach (nauseated) and have no relief from remedies suggested. You vomit blood or coffee ground-like materials.  You lose more than 2 pounds of weight or gain more than 2 pounds of weight over 1 week, or as suggested by your caregiver.  You notice swelling of your face, hands, feet, or legs.  You get exposed to German measles and have never had them.  You are exposed to fifth disease or chickenpox.  You develop belly (abdominal) pain. Round ligament discomfort is a common non-cancerous (benign) cause of abdominal pain in pregnancy. Your caregiver still must evaluate you.  You develop a bad headache that does not go away.  You develop fever, diarrhea, pain with urination, or shortness of breath.  You develop visual problems, blurry, or double vision.  You fall or are in a car accident or any kind of trauma.  There is mental or physical violence at home. Document Released: 01/15/2001 Document Revised: 10/16/2011 Document Reviewed: 07/20/2008 [employer] Patient Information 2014 [employer].  Breastfeeding A change in hormones during your pregnancy causes growth of your breast tissue and an increase in number and size of milk ducts. The hormone prolactin allows proteins, sugars, and fats from your blood supply to make breast milk in your milk-producing glands. The hormone progesterone prevents  breast milk from being released before the birth of your baby. After the birth of your baby, your progesterone level decreases allowing breast milk to be released. Thoughts of your baby, as well as his or her sucking or crying, can stimulate the release of milk from the milk-producing glands. Deciding to breastfeed (nurse) is one of the best choices you can make for you and your baby. The information that follows gives a brief review of the benefits, as well as other important skills to know about breastfeeding. BENEFITS OF BREASTFEEDING For your baby  The first milk (colostrum) helps your baby's digestive system function better.   There are antibodies in your milk that help your baby fight off infections.   Your baby has a lower incidence of asthma, allergies, and sudden infant death syndrome (SIDS).   The nutrients in breast milk are better for your baby than infant formulas.  Breast milk improves your baby's brain development.   Your baby will have less gas, colic, and constipation.  Your baby is less likely to develop other conditions, such as childhood obesity, asthma, or diabetes mellitus. For you  Breastfeeding helps develop a very special bond between you and your baby.   Breastfeeding is convenient, always available at the correct temperature, and costs nothing.   Breastfeeding helps to burn calories and helps you lose the weight gained during pregnancy.   Breastfeeding makes your uterus contract back down to normal size faster and slows bleeding following delivery.   Breastfeeding mothers have a lower risk of developing osteoporosis or breast or ovarian cancer later in life.  BREASTFEEDING FREQUENCY  A healthy, full-term baby may breastfeed as often as every hour or space his or her feedings to every 3 hours. Breastfeeding frequency will vary from baby to baby.   Newborns should be fed no less than every 2 3 hours during the day and every 4 5 hours during the  night. You should breastfeed a minimum of 8 feedings in a 24 hour period.  Awaken your baby to breastfeed if it has been 3 4 hours since the last feeding.  Breastfeed when you feel the need to reduce the fullness of your breasts or when   your newborn shows signs of hunger. Signs that your baby may be hungry include:  Increased alertness or activity.  Stretching.  Movement of the head from side to side.  Movement of the head and opening of the mouth when the corner of the mouth or cheek is stroked (rooting).  Increased sucking sounds, smacking lips, cooing, sighing, or squeaking.  Hand-to-mouth movements.  Increased sucking of fingers or hands.  Fussing.  Intermittent crying.  Signs of extreme hunger will require calming and consoling before you try to feed your baby. Signs of extreme hunger may include:  Restlessness.  A loud, strong cry.  Screaming.  Frequent feeding will help you make more milk and will help prevent problems, such as sore nipples and engorgement of the breasts.  BREASTFEEDING   Whether lying down or sitting, be sure that the baby's abdomen is facing your abdomen.   Support your breast with 4 fingers under your breast and your thumb above your nipple. Make sure your fingers are well away from your nipple and your baby's mouth.   Stroke your baby's lips gently with your finger or nipple.   When your baby's mouth is open wide enough, place all of your nipple and as much of the colored area around your nipple (areola) as possible into your baby's mouth.  More areola should be visible above his or her upper lip than below his or her lower lip.  Your baby's tongue should be between his or her lower gum and your breast.  Ensure that your baby's mouth is correctly positioned around the nipple (latched). Your baby's lips should create a seal on your breast.  Signs that your baby has effectively latched onto your nipple include:  Tugging or sucking  without pain.  Swallowing heard between sucks.  Absent click or smacking sound.  Muscle movement above and in front of his or her ears with sucking.  Your baby must suck about 2 3 minutes in order to get your milk. Allow your baby to feed on each breast as long as he or she wants. Nurse your baby until he or she unlatches or falls asleep at the first breast, then offer the second breast.  Signs that your baby is full and satisfied include:  A gradual decrease in the number of sucks or complete cessation of sucking.  Falling asleep.  Extension or relaxation of his or her body.  Retention of a small amount of milk in his or her mouth.  Letting go of your breast by himself or herself.  Signs of effective breastfeeding in you include:  Breasts that have increased firmness, weight, and size prior to feeding.  Breasts that are softer after nursing.  Increased milk volume, as well as a change in milk consistency and color by the 5th day of breastfeeding.  Breast fullness relieved by breastfeeding.  Nipples are not sore, cracked, or bleeding.  If needed, break the suction by putting your finger into the corner of your baby's mouth and sliding your finger between his or her gums. Then, remove your breast from his or her mouth.  It is common for babies to spit up a small amount after a feeding.  Babies often swallow air during feeding. This can make babies fussy. Burping your baby between breasts can help with this.  Vitamin D supplements are recommended for babies who get only breast milk.  Avoid using a pacifier during your baby's first 4 6 weeks.  Avoid supplemental feedings of water, formula, or   juice in place of breastfeeding. Breast milk is all the food your baby needs. It is not necessary for your baby to have water or formula. Your breasts will make more milk if supplemental feedings are avoided during the early weeks. HOW TO TELL WHETHER YOUR BABY IS GETTING ENOUGH BREAST  MILK Wondering whether or not your baby is getting enough milk is a common concern among mothers. You can be assured that your baby is getting enough milk if:   Your baby is actively sucking and you hear swallowing.   Your baby seems relaxed and satisfied after a feeding.   Your baby nurses at least 8 12 times in a 24 hour time period.  During the first 3 5 days of age:  Your baby is wetting at least 3 5 diapers in a 24 hour period. The urine should be clear and pale yellow.  Your baby is having at least 3 4 stools in a 24 hour period. The stool should be soft and yellow.  At 5 7 days of age, your baby is having at least 3 6 stools in a 24 hour period. The stool should be seedy and yellow by 5 days of age.  Your baby has a weight loss less than 7 10% during the first 3 days of age.  Your baby does not lose weight after 3 7 days of age.  Your baby gains 4 7 ounces each week after he or she is 4 days of age.  Your baby gains weight by 5 days of age and is back to birth weight within 2 weeks. ENGORGEMENT In the first week after your baby is born, you may experience extremely full breasts (engorgement). When engorged, your breasts may feel heavy, warm, or tender to the touch. Engorgement peaks within 24 48 hours after delivery of your baby.  Engorgement may be reduced by:  Continuing to breastfeed.  Increasing the frequency of breastfeeding.  Taking warm showers or applying warm, moist heat to your breasts just before each feeding. This increases circulation and helps the milk flow.   Gently massaging your breast before and during the feedings. With your fingertips, massage from your chest wall towards your nipple in a circular motion.   Ensuring that your baby empties at least one breast at every feeding. It also helps to start the next feeding on the opposite breast.   Expressing breast milk by hand or by using a breast pump to empty the breasts if your baby is sleepy, or  not nursing well. You may also want to express milk if you are returning to work oryou feel you are getting engorged.  Ensuring your baby is latched on and positioned properly while breastfeeding. If you follow these suggestions, your engorgement should improve in 24 48 hours. If you are still experiencing difficulty, call your lactation consultant or caregiver.  CARING FOR YOURSELF Take care of your breasts.  Bathe or shower daily.   Avoid using soap on your nipples.   Wear a supportive bra. Avoid wearing underwire style bras.  Air dry your nipples for a 3 4minutes after each feeding.   Use only cotton bra pads to absorb breast milk leakage. Leaking of breast milk between feedings is normal.   Use only pure lanolin on your nipples after nursing. You do not need to wash it off before feeding your baby again. Another option is to express a few drops of breast milk and gently massage that milk into your nipples.  Continue   breast self-awareness checks. Take care of yourself.  Eat healthy foods. Alternate 3 meals with 3 snacks.  Avoid foods that you notice affect your baby in a bad way.  Drink milk, fruit juice, and water to satisfy your thirst (about 8 glasses a day).   Rest often, relax, and take your prenatal vitamins to prevent fatigue, stress, and anemia.  Avoid chewing and smoking tobacco.  Avoid alcohol and drug use.  Take over-the-counter and prescribed medicine only as directed by your caregiver or pharmacist. You should always check with your caregiver or pharmacist before taking any new medicine, vitamin, or herbal supplement.  Know that pregnancy is possible while breastfeeding. If desired, talk to your caregiver about family planning and safe birth control methods that may be used while breastfeeding. SEEK MEDICAL CARE IF:   You feel like you want to stop breastfeeding or have become frustrated with breastfeeding.  You have painful breasts or nipples.  Your  nipples are cracked or bleeding.  Your breasts are red, tender, or warm.  You have a swollen area on either breast.  You have a fever or chills.  You have nausea or vomiting.  You have drainage from your nipples.  Your breasts do not become full before feedings by the 5th day after delivery.  You feel sad and depressed.  Your baby is too sleepy to eat well.  Your baby is having trouble sleeping.   Your baby is wetting less than 3 diapers in a 24 hour period.  Your baby has less than 3 stools in a 24 hour period.  Your baby's skin or the white part of his or her eyes becomes more yellow.   Your baby is not gaining weight by 5 days of age. MAKE SURE YOU:   Understand these instructions.  Will watch your condition.  Will get help right away if you are not doing well or get worse. Document Released: 01/21/2005 Document Revised: 10/16/2011 Document Reviewed: 08/28/2011 [employer] Patient Information 2014 [employer].  

## 2012-11-11 NOTE — Progress Notes (Signed)
Doing well--scheduled for U/s next week. NIPS WNL

## 2012-11-11 NOTE — Progress Notes (Signed)
P = 77 

## 2012-11-17 ENCOUNTER — Ambulatory Visit (HOSPITAL_COMMUNITY)
Admission: RE | Admit: 2012-11-17 | Discharge: 2012-11-17 | Disposition: A | Discharge status: Home / Self Care | Payer: BC Managed Care – PPO | Source: Ambulatory Visit | Attending: Obstetrics and Gynecology | Admitting: Obstetrics and Gynecology

## 2012-11-17 ENCOUNTER — Encounter: Payer: Self-pay | Admitting: Obstetrics & Gynecology

## 2012-11-17 ENCOUNTER — Other Ambulatory Visit: Payer: Self-pay | Admitting: Obstetrics and Gynecology

## 2012-11-17 ENCOUNTER — Other Ambulatory Visit: Payer: Self-pay | Admitting: Obstetrics & Gynecology

## 2012-11-17 DIAGNOSIS — O4400 Placenta previa specified as without hemorrhage, unspecified trimester: Secondary | ICD-10-CM

## 2012-11-17 DIAGNOSIS — IMO0002 Reserved for concepts with insufficient information to code with codable children: Secondary | ICD-10-CM

## 2012-11-17 DIAGNOSIS — Z3689 Encounter for other specified antenatal screening: Secondary | ICD-10-CM | POA: Insufficient documentation

## 2012-11-17 DIAGNOSIS — O09519 Supervision of elderly primigravida, unspecified trimester: Secondary | ICD-10-CM | POA: Insufficient documentation

## 2012-11-17 DIAGNOSIS — O4440 Low lying placenta NOS or without hemorrhage, unspecified trimester: Secondary | ICD-10-CM | POA: Insufficient documentation

## 2012-12-09 ENCOUNTER — Encounter: Payer: BC Managed Care – PPO | Admitting: Obstetrics and Gynecology

## 2012-12-09 ENCOUNTER — Ambulatory Visit (INDEPENDENT_AMBULATORY_CARE_PROVIDER_SITE_OTHER): Payer: BC Managed Care – PPO | Admitting: Obstetrics and Gynecology

## 2012-12-09 ENCOUNTER — Encounter: Payer: Self-pay | Admitting: Obstetrics and Gynecology

## 2012-12-09 VITALS — BP 121/62 | Wt 140.6 lb

## 2012-12-09 DIAGNOSIS — O4402 Placenta previa specified as without hemorrhage, second trimester: Secondary | ICD-10-CM

## 2012-12-09 DIAGNOSIS — O4400 Placenta previa specified as without hemorrhage, unspecified trimester: Secondary | ICD-10-CM

## 2012-12-09 DIAGNOSIS — O09529 Supervision of elderly multigravida, unspecified trimester: Secondary | ICD-10-CM

## 2012-12-09 DIAGNOSIS — Z3402 Encounter for supervision of normal first pregnancy, second trimester: Secondary | ICD-10-CM

## 2012-12-09 DIAGNOSIS — Z3400 Encounter for supervision of normal first pregnancy, unspecified trimester: Secondary | ICD-10-CM

## 2012-12-09 DIAGNOSIS — IMO0002 Reserved for concepts with insufficient information to code with codable children: Secondary | ICD-10-CM

## 2012-12-09 NOTE — Progress Notes (Signed)
P= 80 

## 2012-12-09 NOTE — Progress Notes (Signed)
Patient doing well without complaints. Reviewed ultrasound and NIPS result. Patient scheduled for f/u ultrasound on 11/25.

## 2012-12-29 ENCOUNTER — Other Ambulatory Visit: Payer: Self-pay | Admitting: Obstetrics & Gynecology

## 2012-12-29 ENCOUNTER — Ambulatory Visit (HOSPITAL_COMMUNITY): Admission: RE | Admit: 2012-12-29 | Payer: BC Managed Care – PPO | Source: Ambulatory Visit

## 2012-12-29 ENCOUNTER — Ambulatory Visit (HOSPITAL_COMMUNITY)
Admission: RE | Admit: 2012-12-29 | Discharge: 2012-12-29 | Disposition: A | Discharge status: Home / Self Care | Payer: BC Managed Care – PPO | Source: Ambulatory Visit | Attending: Obstetrics & Gynecology | Admitting: Obstetrics & Gynecology

## 2012-12-29 DIAGNOSIS — IMO0002 Reserved for concepts with insufficient information to code with codable children: Secondary | ICD-10-CM

## 2012-12-29 DIAGNOSIS — O09519 Supervision of elderly primigravida, unspecified trimester: Secondary | ICD-10-CM | POA: Insufficient documentation

## 2012-12-29 DIAGNOSIS — O4400 Placenta previa specified as without hemorrhage, unspecified trimester: Secondary | ICD-10-CM

## 2012-12-30 ENCOUNTER — Encounter: Payer: Self-pay | Admitting: Obstetrics & Gynecology

## 2013-01-07 ENCOUNTER — Ambulatory Visit (INDEPENDENT_AMBULATORY_CARE_PROVIDER_SITE_OTHER): Payer: BC Managed Care – PPO | Admitting: Obstetrics & Gynecology

## 2013-01-07 ENCOUNTER — Encounter: Payer: Self-pay | Admitting: Obstetrics & Gynecology

## 2013-01-07 VITALS — BP 122/67 | Wt 144.0 lb

## 2013-01-07 DIAGNOSIS — Z23 Encounter for immunization: Secondary | ICD-10-CM

## 2013-01-07 DIAGNOSIS — O09519 Supervision of elderly primigravida, unspecified trimester: Secondary | ICD-10-CM

## 2013-01-07 DIAGNOSIS — Z3480 Encounter for supervision of other normal pregnancy, unspecified trimester: Secondary | ICD-10-CM

## 2013-01-07 LAB — CBC
MCH: 30.9 pg (ref 26.0–34.0)
MCHC: 35 g/dL (ref 30.0–36.0)
MCV: 88.1 fL (ref 78.0–100.0)
Platelets: 244 10*3/uL (ref 150–400)
WBC: 6.6 10*3/uL (ref 4.0–10.5)

## 2013-01-07 NOTE — Progress Notes (Signed)
Routine visit. Good FM. No problems. Glucola, labs, TDAP today. We discussed a low lying placenta and I told them I will repeat u/s in the late 3rd trimester to check on the position.

## 2013-01-07 NOTE — Progress Notes (Signed)
P - 67 

## 2013-01-08 LAB — GLUCOSE TOLERANCE, 1 HOUR (50G) W/O FASTING: Glucose, 1 Hour GTT: 112 mg/dL (ref 70–140)

## 2013-01-08 LAB — HIV ANTIBODY (ROUTINE TESTING W REFLEX): HIV: NONREACTIVE

## 2013-01-21 ENCOUNTER — Ambulatory Visit (INDEPENDENT_AMBULATORY_CARE_PROVIDER_SITE_OTHER): Payer: BC Managed Care – PPO | Admitting: Obstetrics and Gynecology

## 2013-01-21 ENCOUNTER — Encounter: Payer: Self-pay | Admitting: Obstetrics and Gynecology

## 2013-01-21 VITALS — BP 122/66 | Wt 144.0 lb

## 2013-01-21 DIAGNOSIS — O4403 Placenta previa specified as without hemorrhage, third trimester: Secondary | ICD-10-CM

## 2013-01-21 DIAGNOSIS — O09513 Supervision of elderly primigravida, third trimester: Secondary | ICD-10-CM

## 2013-01-21 DIAGNOSIS — O09519 Supervision of elderly primigravida, unspecified trimester: Secondary | ICD-10-CM

## 2013-01-21 DIAGNOSIS — O4400 Placenta previa specified as without hemorrhage, unspecified trimester: Secondary | ICD-10-CM

## 2013-01-21 NOTE — Progress Notes (Signed)
Patient doing well without complaints. FM/PTL precautions reviewed. 1 hr GCT results reviewed

## 2013-01-21 NOTE — Progress Notes (Signed)
P-[id number] 

## 2013-02-04 NOTE — L&D Delivery Note (Signed)
Attestation of Attending Supervision of Advanced Practitioner (PA/CNM/NP): Evaluation and management procedures were performed by the Advanced Practitioner under my supervision and collaboration.  I have reviewed the Advanced Practitioner's note and chart, and I agree with the management and plan.  Jamelle Goldston S, MD [facility] Attending 04/21/2013 6:48 AM   

## 2013-02-04 NOTE — L&D Delivery Note (Signed)
Delivery Note At 12:25 AM a viable female was delivered via Vaginal, Spontaneous Delivery (Presentation: Right Occiput Anterior).  APGAR: 7, 9; weight 7 lb 8.5 oz (3415 g).   Placenta status: Intact, Spontaneous.  Cord: 3 vessels with the following complications: None.  Cord pH: not collected  Anesthesia: Epidural  Episiotomy: N/A  Lacerations: 1st degree;Labial Suture Repair: 3.0 vicryl rapide Est. Blood Loss (mL): 350   Mom to postpartum.  Baby to Couplet care / Skin to Skin.  Ms. [name] had IOL for postdates. She received cytotec x2 and then pitocin. She progressed to active labor. She pushed and for SVD of viable girl. Light meconium, baby given to nurses under warmer after father cut cord. 1st degree L labial tear, repaired in the usual fashion. Hemostatic, counts correct x2. Continue routine post-partum care. [name] was present and supervised entire delivery.   Michaelene SongHall, Jonathan C 04/21/2013, 1:05 AM

## 2013-02-11 ENCOUNTER — Ambulatory Visit (INDEPENDENT_AMBULATORY_CARE_PROVIDER_SITE_OTHER): Payer: BC Managed Care – PPO | Admitting: Obstetrics & Gynecology

## 2013-02-11 ENCOUNTER — Encounter: Payer: Self-pay | Admitting: Obstetrics & Gynecology

## 2013-02-11 VITALS — BP 119/72 | Wt 147.0 lb

## 2013-02-11 DIAGNOSIS — O4410 Placenta previa with hemorrhage, unspecified trimester: Secondary | ICD-10-CM

## 2013-02-11 DIAGNOSIS — Z3480 Encounter for supervision of other normal pregnancy, unspecified trimester: Secondary | ICD-10-CM

## 2013-02-11 DIAGNOSIS — O09519 Supervision of elderly primigravida, unspecified trimester: Secondary | ICD-10-CM

## 2013-02-11 DIAGNOSIS — O4453 Low lying placenta with hemorrhage, third trimester: Secondary | ICD-10-CM

## 2013-02-11 NOTE — Progress Notes (Signed)
P-78 

## 2013-02-11 NOTE — Progress Notes (Signed)
Routine visit. Good Fm. No OB problems. She complains of cold symptoms but definitely does not think that it is the flu. I will reorder an u/s to follow up on placental location.

## 2013-02-23 ENCOUNTER — Ambulatory Visit (HOSPITAL_COMMUNITY)
Admission: RE | Admit: 2013-02-23 | Discharge: 2013-02-23 | Disposition: A | Discharge status: Home / Self Care | Payer: BC Managed Care – PPO | Source: Ambulatory Visit | Attending: Obstetrics & Gynecology | Admitting: Obstetrics & Gynecology

## 2013-02-23 ENCOUNTER — Encounter: Payer: BC Managed Care – PPO | Admitting: Obstetrics & Gynecology

## 2013-02-23 ENCOUNTER — Ambulatory Visit (INDEPENDENT_AMBULATORY_CARE_PROVIDER_SITE_OTHER): Payer: BC Managed Care – PPO | Admitting: Obstetrics & Gynecology

## 2013-02-23 ENCOUNTER — Encounter (HOSPITAL_COMMUNITY): Payer: Self-pay

## 2013-02-23 VITALS — BP 125/73 | Wt 148.0 lb

## 2013-02-23 VITALS — BP 125/76 | HR 108

## 2013-02-23 DIAGNOSIS — O4453 Low lying placenta with hemorrhage, third trimester: Secondary | ICD-10-CM

## 2013-02-23 DIAGNOSIS — O4440 Low lying placenta NOS or without hemorrhage, unspecified trimester: Secondary | ICD-10-CM

## 2013-02-23 DIAGNOSIS — Z3480 Encounter for supervision of other normal pregnancy, unspecified trimester: Secondary | ICD-10-CM

## 2013-02-23 DIAGNOSIS — O09519 Supervision of elderly primigravida, unspecified trimester: Secondary | ICD-10-CM | POA: Insufficient documentation

## 2013-02-23 DIAGNOSIS — Q02 Microcephaly: Secondary | ICD-10-CM

## 2013-02-23 DIAGNOSIS — O4400 Placenta previa specified as without hemorrhage, unspecified trimester: Secondary | ICD-10-CM | POA: Insufficient documentation

## 2013-02-23 NOTE — Progress Notes (Signed)
P-75 

## 2013-02-23 NOTE — Progress Notes (Signed)
MATERNAL FETAL MEDICINE CONSULT  Patient Name: Margaret Rosario Medical Record Number:  [id number] Date of Birth: [birth year] Requesting Physician Name:  Allie BossierMyra C Dove, MD Date of Service: 02/23/2013  Chief Complaint Fetal microcephaly  History of Present Illness Margaret Rosario was seen today secondary to fetal microcephaly at the request of Allie BossierMyra C Dove, MD.  The patient is a 38 y.o. G1P0,at 3281w1d with an EDD of 04/12/2013, by Last Menstrual Period dating method.  Ms. [name] had an ultrasound earlier today which demonstrated a fetal head circumference of less than the 3rd percentile.  The remainder of her ultrasound was normal including the intracranial anatomy.  She had a low-lying placenta earlier in pregnancy, but it has resolved on today's ultrasound.  Otherwise her pregnancy has been uncomplicated.  Review of Systems Pertinent items are noted in HPI.   Physical Examination Filed Vitals:   02/23/13 1340  BP: 125/76  Pulse: 108    Assessment and Recommendations 1.  Isolated microcephaly.  Ms. [name] and her husband were understandable anxious about the implications of the microcephaly.  I explained that most likely there will be no clinical impact of this finding and their baby will develop, be born, and grow normally.  However, microcephaly is associated with a slightly increased risk of TORCH (particularly CMV) infection and developmental delay presenting during infancy.  Ms. [name] has already had serology tests drawn for TORCH infections.  We will repeat an ultrasound in 3 weeks to reassess fetal head size and intracranial anatomy.  I spent 15 minutes with Ms. [name] today of which 50% was face-to-face counseling.  [name].  Please do not hesitate to contact us with questions.   Rema FendtNITSCHE,Johnisha Louks, MD

## 2013-02-24 LAB — RPR

## 2013-02-24 LAB — HSV 1 ANTIBODY, IGG: HSV 1 GLYCOPROTEIN G AB, IGG: 8.16 IV — AB

## 2013-02-24 LAB — RUBELLA SCREEN: Rubella: 1.45 Index — ABNORMAL HIGH (ref <0.90)

## 2013-02-24 LAB — VARICELLA ZOSTER ANTIBODY, IGM: VARICELLA ZOSTER AB IGM: 0.63 {ISR} (ref <0.91)

## 2013-02-24 LAB — HSV 2 ANTIBODY, IGG

## 2013-02-25 LAB — TOXOPLASMA GONDII ANTIBODY, IGG: Toxoplasma IgG Ratio: <3 IU/mL (ref <7.2)

## 2013-03-01 DIAGNOSIS — O3507X0 Maternal care for (suspected) central nervous system malformation or damage in fetus, microcephaly, not applicable or unspecified: Secondary | ICD-10-CM | POA: Insufficient documentation

## 2013-03-02 ENCOUNTER — Other Ambulatory Visit (INDEPENDENT_AMBULATORY_CARE_PROVIDER_SITE_OTHER): Payer: BC Managed Care – PPO | Admitting: *Deleted

## 2013-03-02 DIAGNOSIS — O3507X0 Maternal care for (suspected) central nervous system malformation or damage in fetus, microcephaly, not applicable or unspecified: Secondary | ICD-10-CM

## 2013-03-02 DIAGNOSIS — O3500X0 Maternal care for (suspected) central nervous system malformation or damage in fetus, unspecified, not applicable or unspecified: Secondary | ICD-10-CM

## 2013-03-02 DIAGNOSIS — O350XX0 Maternal care for (suspected) central nervous system malformation in fetus, not applicable or unspecified: Secondary | ICD-10-CM

## 2013-03-04 LAB — CMV IGM: CMV IgM: <8 AU/mL (ref <30.00)

## 2013-03-04 LAB — CYTOMEGALOVIRUS ANTIBODY, IGG: CYTOMEGALOVIRUS AB-IGG: 0.35 U/mL (ref <0.60)

## 2013-03-16 ENCOUNTER — Ambulatory Visit (INDEPENDENT_AMBULATORY_CARE_PROVIDER_SITE_OTHER): Payer: BC Managed Care – PPO | Admitting: *Deleted

## 2013-03-16 ENCOUNTER — Other Ambulatory Visit: Payer: Self-pay | Admitting: Obstetrics and Gynecology

## 2013-03-16 DIAGNOSIS — O09519 Supervision of elderly primigravida, unspecified trimester: Secondary | ICD-10-CM

## 2013-03-16 DIAGNOSIS — O09529 Supervision of elderly multigravida, unspecified trimester: Secondary | ICD-10-CM

## 2013-03-17 ENCOUNTER — Ambulatory Visit (HOSPITAL_COMMUNITY)
Admission: RE | Admit: 2013-03-17 | Discharge: 2013-03-17 | Disposition: A | Discharge status: Home / Self Care | Payer: BC Managed Care – PPO | Source: Ambulatory Visit | Attending: Obstetrics & Gynecology | Admitting: Obstetrics & Gynecology

## 2013-03-19 ENCOUNTER — Ambulatory Visit (INDEPENDENT_AMBULATORY_CARE_PROVIDER_SITE_OTHER): Payer: BC Managed Care – PPO | Admitting: Obstetrics & Gynecology

## 2013-03-19 VITALS — BP 112/66 | Wt 154.0 lb

## 2013-03-19 DIAGNOSIS — O350XX0 Maternal care for (suspected) central nervous system malformation in fetus, not applicable or unspecified: Secondary | ICD-10-CM

## 2013-03-19 DIAGNOSIS — O3500X0 Maternal care for (suspected) central nervous system malformation or damage in fetus, unspecified, not applicable or unspecified: Secondary | ICD-10-CM

## 2013-03-19 DIAGNOSIS — Z3400 Encounter for supervision of normal first pregnancy, unspecified trimester: Secondary | ICD-10-CM

## 2013-03-19 DIAGNOSIS — O3507X0 Maternal care for (suspected) central nervous system malformation or damage in fetus, microcephaly, not applicable or unspecified: Secondary | ICD-10-CM

## 2013-03-19 LAB — OB RESULTS CONSOLE GC/CHLAMYDIA
Chlamydia: NEGATIVE
Gonorrhea: NEGATIVE

## 2013-03-19 LAB — OB RESULTS CONSOLE GBS: GBS: NEGATIVE

## 2013-03-19 NOTE — Progress Notes (Signed)
Routine visit. Good FM. Culures obtained today. NST reviewed and reactive.

## 2013-03-19 NOTE — Progress Notes (Signed)
P-[id number] 

## 2013-03-20 LAB — GC/CHLAMYDIA PROBE AMP
CT Probe RNA: NEGATIVE
GC Probe RNA: NEGATIVE

## 2013-03-22 ENCOUNTER — Ambulatory Visit (INDEPENDENT_AMBULATORY_CARE_PROVIDER_SITE_OTHER): Payer: BC Managed Care – PPO | Admitting: *Deleted

## 2013-03-22 DIAGNOSIS — O09529 Supervision of elderly multigravida, unspecified trimester: Secondary | ICD-10-CM

## 2013-03-22 DIAGNOSIS — O09519 Supervision of elderly primigravida, unspecified trimester: Secondary | ICD-10-CM

## 2013-03-22 LAB — CULTURE, BETA STREP (GROUP B ONLY)

## 2013-03-23 ENCOUNTER — Other Ambulatory Visit: Payer: Self-pay

## 2013-03-25 ENCOUNTER — Encounter: Payer: Self-pay | Admitting: Obstetrics and Gynecology

## 2013-03-25 ENCOUNTER — Ambulatory Visit (INDEPENDENT_AMBULATORY_CARE_PROVIDER_SITE_OTHER): Payer: BC Managed Care – PPO | Admitting: Obstetrics and Gynecology

## 2013-03-25 VITALS — BP 127/77 | Wt 154.0 lb

## 2013-03-25 DIAGNOSIS — O09519 Supervision of elderly primigravida, unspecified trimester: Secondary | ICD-10-CM

## 2013-03-25 DIAGNOSIS — O4440 Low lying placenta NOS or without hemorrhage, unspecified trimester: Secondary | ICD-10-CM

## 2013-03-25 DIAGNOSIS — O4400 Placenta previa specified as without hemorrhage, unspecified trimester: Secondary | ICD-10-CM

## 2013-03-25 DIAGNOSIS — Z3480 Encounter for supervision of other normal pregnancy, unspecified trimester: Secondary | ICD-10-CM

## 2013-03-25 NOTE — Progress Notes (Signed)
P=79 

## 2013-03-25 NOTE — Progress Notes (Signed)
Patient is doing well without complaints. FM/labor precautions reviewed. NST reviewed and reactive. Patient's husband is very anxious regarding follow up ultrasound appointment on 2/23. They really want to know the results that same day. He states that the plan has been for induction of labor at 39 weeks due to SGA. Informed patient that the results of the ultrasound will indicate whether there is evidence of growth restrictions and the decision for induction will be made at that time.

## 2013-03-26 LAB — US OB DETAIL + 14 WK

## 2013-03-29 ENCOUNTER — Other Ambulatory Visit: Payer: BC Managed Care – PPO

## 2013-03-29 ENCOUNTER — Ambulatory Visit (HOSPITAL_COMMUNITY)
Admission: RE | Admit: 2013-03-29 | Discharge: 2013-03-29 | Disposition: A | Discharge status: Home / Self Care | Payer: BC Managed Care – PPO | Source: Ambulatory Visit | Attending: Obstetrics & Gynecology | Admitting: Obstetrics & Gynecology

## 2013-03-29 ENCOUNTER — Encounter: Payer: Self-pay | Admitting: Obstetrics and Gynecology

## 2013-03-29 ENCOUNTER — Encounter: Payer: Self-pay | Admitting: Obstetrics & Gynecology

## 2013-03-29 ENCOUNTER — Ambulatory Visit (INDEPENDENT_AMBULATORY_CARE_PROVIDER_SITE_OTHER): Payer: BC Managed Care – PPO | Admitting: Obstetrics & Gynecology

## 2013-03-29 DIAGNOSIS — Z3480 Encounter for supervision of other normal pregnancy, unspecified trimester: Secondary | ICD-10-CM

## 2013-03-29 DIAGNOSIS — O350XX0 Maternal care for (suspected) central nervous system malformation in fetus, not applicable or unspecified: Secondary | ICD-10-CM

## 2013-03-29 DIAGNOSIS — O09519 Supervision of elderly primigravida, unspecified trimester: Secondary | ICD-10-CM

## 2013-03-29 DIAGNOSIS — O3507X0 Maternal care for (suspected) central nervous system malformation or damage in fetus, microcephaly, not applicable or unspecified: Secondary | ICD-10-CM

## 2013-03-29 DIAGNOSIS — O3500X0 Maternal care for (suspected) central nervous system malformation or damage in fetus, unspecified, not applicable or unspecified: Secondary | ICD-10-CM | POA: Insufficient documentation

## 2013-03-29 NOTE — Progress Notes (Signed)
Routine visit. Good FM. NST reactive. U/S shows growth of HC to 8%. No problems

## 2013-04-01 ENCOUNTER — Encounter: Payer: BC Managed Care – PPO | Admitting: Family Medicine

## 2013-04-01 ENCOUNTER — Ambulatory Visit (HOSPITAL_COMMUNITY): Payer: BC Managed Care – PPO

## 2013-04-02 ENCOUNTER — Ambulatory Visit (INDEPENDENT_AMBULATORY_CARE_PROVIDER_SITE_OTHER): Payer: BC Managed Care – PPO | Admitting: Nurse Practitioner

## 2013-04-02 VITALS — BP 116/76 | Wt 156.2 lb

## 2013-04-02 DIAGNOSIS — Z3400 Encounter for supervision of normal first pregnancy, unspecified trimester: Secondary | ICD-10-CM

## 2013-04-02 NOTE — Progress Notes (Signed)
Doing well, no complaints other than ready to have baby. Denies any LOF or contractions. No change in fundal height. NST reactive.

## 2013-04-02 NOTE — Progress Notes (Signed)
P=79 

## 2013-04-06 ENCOUNTER — Ambulatory Visit (INDEPENDENT_AMBULATORY_CARE_PROVIDER_SITE_OTHER): Payer: BC Managed Care – PPO | Admitting: Obstetrics & Gynecology

## 2013-04-06 VITALS — BP 123/80 | Wt 156.0 lb

## 2013-04-06 DIAGNOSIS — Z3480 Encounter for supervision of other normal pregnancy, unspecified trimester: Secondary | ICD-10-CM

## 2013-04-06 DIAGNOSIS — O09519 Supervision of elderly primigravida, unspecified trimester: Secondary | ICD-10-CM

## 2013-04-06 NOTE — Progress Notes (Signed)
Routine visit. Good FM. No problems. I stripped her membranes. She will come back for another cervical exam on Thursday and I may induce her on Friday (when I am on L&D) if she is favorable.

## 2013-04-06 NOTE — Progress Notes (Signed)
P-65 

## 2013-04-08 ENCOUNTER — Ambulatory Visit (INDEPENDENT_AMBULATORY_CARE_PROVIDER_SITE_OTHER): Payer: BC Managed Care – PPO | Admitting: Obstetrics & Gynecology

## 2013-04-08 VITALS — BP 125/76 | Wt 156.0 lb

## 2013-04-08 DIAGNOSIS — O09519 Supervision of elderly primigravida, unspecified trimester: Secondary | ICD-10-CM

## 2013-04-08 DIAGNOSIS — O4440 Low lying placenta NOS or without hemorrhage, unspecified trimester: Secondary | ICD-10-CM

## 2013-04-08 DIAGNOSIS — O4400 Placenta previa specified as without hemorrhage, unspecified trimester: Secondary | ICD-10-CM

## 2013-04-08 NOTE — Progress Notes (Signed)
P-61

## 2013-04-08 NOTE — Progress Notes (Signed)
No cervical change.  Patient understands induction is not going to be done for now.  Will continue to monitor.  No other complaints or concerns.  Fetal movement and labor precautions reviewed.  Postdates testing to start next week.

## 2013-04-08 NOTE — Patient Instructions (Signed)
Return to clinic for any obstetric concerns or go to [facility] for evaluation  

## 2013-04-12 ENCOUNTER — Inpatient Hospital Stay (HOSPITAL_COMMUNITY)
Admission: AD | Admit: 2013-04-12 | Payer: BC Managed Care – PPO | Source: Ambulatory Visit | Admitting: Obstetrics & Gynecology

## 2013-04-13 ENCOUNTER — Encounter: Payer: Self-pay | Admitting: Family Medicine

## 2013-04-13 ENCOUNTER — Ambulatory Visit (INDEPENDENT_AMBULATORY_CARE_PROVIDER_SITE_OTHER): Payer: BC Managed Care – PPO | Admitting: Family Medicine

## 2013-04-13 VITALS — BP 126/75 | Wt 157.0 lb

## 2013-04-13 DIAGNOSIS — O480 Post-term pregnancy: Secondary | ICD-10-CM

## 2013-04-13 DIAGNOSIS — Z3400 Encounter for supervision of normal first pregnancy, unspecified trimester: Secondary | ICD-10-CM

## 2013-04-13 DIAGNOSIS — O09519 Supervision of elderly primigravida, unspecified trimester: Secondary | ICD-10-CM

## 2013-04-13 NOTE — Progress Notes (Signed)
P - 69 

## 2013-04-13 NOTE — Patient Instructions (Signed)
Third Trimester of Pregnancy The third trimester is from week 29 through week 42, months 7 through 9. The third trimester is a time when the fetus is growing rapidly. At the end of the ninth month, the fetus is about 20 inches in length and weighs 6 10 pounds.  BODY CHANGES Your body goes through many changes during pregnancy. The changes vary from woman to woman.   Your weight will continue to increase. You can expect to gain 25 35 pounds (11 16 kg) by the end of the pregnancy.  You may begin to get stretch marks on your hips, abdomen, and breasts.  You may urinate more often because the fetus is moving lower into your pelvis and pressing on your bladder.  You may develop or continue to have heartburn as a result of your pregnancy.  You may develop constipation because certain hormones are causing the muscles that push waste through your intestines to slow down.  You may develop hemorrhoids or swollen, bulging veins (varicose veins).  You may have pelvic pain because of the weight gain and pregnancy hormones relaxing your joints between the bones in your pelvis. Back aches may result from over exertion of the muscles supporting your posture.  Your breasts will continue to grow and be tender. A yellow discharge may leak from your breasts called colostrum.  Your belly button may stick out.  You may feel short of breath because of your expanding uterus.  You may notice the fetus "dropping," or moving lower in your abdomen.  You may have a bloody mucus discharge. This usually occurs a few days to a week before labor begins.  Your cervix becomes thin and soft (effaced) near your due date. WHAT TO EXPECT AT YOUR PRENATAL EXAMS  You will have prenatal exams every 2 weeks until week 36. Then, you will have weekly prenatal exams. During a routine prenatal visit:  You will be weighed to make sure you and the fetus are growing normally.  Your blood pressure is taken.  Your abdomen will  be measured to track your baby's growth.  The fetal heartbeat will be listened to.  Any test results from the previous visit will be discussed.  You may have a cervical check near your due date to see if you have effaced. At around 36 weeks, your caregiver will check your cervix. At the same time, your caregiver will also perform a test on the secretions of the vaginal tissue. This test is to determine if a type of bacteria, Group B streptococcus, is present. Your caregiver will explain this further. Your caregiver may ask you:  What your birth plan is.  How you are feeling.  If you are feeling the baby move.  If you have had any abnormal symptoms, such as leaking fluid, bleeding, severe headaches, or abdominal cramping.  If you have any questions. Other tests or screenings that may be performed during your third trimester include:  Blood tests that check for low iron levels (anemia).  Fetal testing to check the health, activity level, and growth of the fetus. Testing is done if you have certain medical conditions or if there are problems during the pregnancy. FALSE LABOR You may feel small, irregular contractions that eventually go away. These are called Braxton Hicks contractions, or false labor. Contractions may last for hours, days, or even weeks before true labor sets in. If contractions come at regular intervals, intensify, or become painful, it is best to be seen by your caregiver.    SIGNS OF LABOR   Menstrual-like cramps.  Contractions that are 5 minutes apart or less.  Contractions that start on the top of the uterus and spread down to the lower abdomen and back.  A sense of increased pelvic pressure or back pain.  A watery or bloody mucus discharge that comes from the vagina. If you have any of these signs before the 37th week of pregnancy, call your caregiver right away. You need to go to the hospital to get checked immediately. HOME CARE INSTRUCTIONS   Avoid all  smoking, herbs, alcohol, and unprescribed drugs. These chemicals affect the formation and growth of the baby.  Follow your caregiver's instructions regarding medicine use. There are medicines that are either safe or unsafe to take during pregnancy.  Exercise only as directed by your caregiver. Experiencing uterine cramps is a good sign to stop exercising.  Continue to eat regular, healthy meals.  Wear a good support bra for breast tenderness.  Do not use hot tubs, steam rooms, or saunas.  Wear your seat belt at all times when driving.  Avoid raw meat, uncooked cheese, cat litter boxes, and soil used by cats. These carry germs that can cause birth defects in the baby.  Take your prenatal vitamins.  Try taking a stool softener (if your caregiver approves) if you develop constipation. Eat more high-fiber foods, such as fresh vegetables or fruit and whole grains. Drink plenty of fluids to keep your urine clear or pale yellow.  Take warm sitz baths to soothe any pain or discomfort caused by hemorrhoids. Use hemorrhoid cream if your caregiver approves.  If you develop varicose veins, wear support hose. Elevate your feet for 15 minutes, 3 4 times a day. Limit salt in your diet.  Avoid heavy lifting, wear low heal shoes, and practice good posture.  Rest a lot with your legs elevated if you have leg cramps or low back pain.  Visit your dentist if you have not gone during your pregnancy. Use a soft toothbrush to brush your teeth and be gentle when you floss.  A sexual relationship may be continued unless your caregiver directs you otherwise.  Do not travel far distances unless it is absolutely necessary and only with the approval of your caregiver.  Take prenatal classes to understand, practice, and ask questions about the labor and delivery.  Make a trial run to the hospital.  Pack your hospital bag.  Prepare the baby's nursery.  Continue to go to all your prenatal visits as directed  by your caregiver. SEEK MEDICAL CARE IF:  You are unsure if you are in labor or if your water has broken.  You have dizziness.  You have mild pelvic cramps, pelvic pressure, or nagging pain in your abdominal area.  You have persistent nausea, vomiting, or diarrhea.  You have a bad smelling vaginal discharge.  You have pain with urination. SEEK IMMEDIATE MEDICAL CARE IF:   You have a fever.  You are leaking fluid from your vagina.  You have spotting or bleeding from your vagina.  You have severe abdominal cramping or pain.  You have rapid weight loss or gain.  You have shortness of breath with chest pain.  You notice sudden or extreme swelling of your face, hands, ankles, feet, or legs.  You have not felt your baby move in over an hour.  You have severe headaches that do not go away with medicine.  You have vision changes. Document Released: 01/15/2001 Document Revised: 09/23/2012 Document Reviewed:   03/24/2012 [employer] Patient Information 2014 [employer].  Breastfeeding Deciding to breastfeed is one of the best choices you can make for you and your baby. A change in hormones during pregnancy causes your breast tissue to grow and increases the number and size of your milk ducts. These hormones also allow proteins, sugars, and fats from your blood supply to make breast milk in your milk-producing glands. Hormones prevent breast milk from being released before your baby is born as well as prompt milk flow after birth. Once breastfeeding has begun, thoughts of your baby, as well as his or her sucking or crying, can stimulate the release of milk from your milk-producing glands.  BENEFITS OF BREASTFEEDING For Your Baby  Your first milk (colostrum) helps your baby's digestive system function better.   There are antibodies in your milk that help your baby fight off infections.   Your baby has a lower incidence of asthma, allergies, and sudden infant death syndrome.    The nutrients in breast milk are better for your baby than infant formulas and are designed uniquely for your baby's needs.   Breast milk improves your baby's brain development.   Your baby is less likely to develop other conditions, such as childhood obesity, asthma, or type 2 diabetes mellitus.  For You   Breastfeeding helps to create a very special bond between you and your baby.   Breastfeeding is convenient. Breast milk is always available at the correct temperature and costs nothing.   Breastfeeding helps to burn calories and helps you lose the weight gained during pregnancy.   Breastfeeding makes your uterus contract to its prepregnancy size faster and slows bleeding (lochia) after you give birth.   Breastfeeding helps to lower your risk of developing type 2 diabetes mellitus, osteoporosis, and breast or ovarian cancer later in life. SIGNS THAT YOUR BABY IS HUNGRY Early Signs of Hunger  Increased alertness or activity.  Stretching.  Movement of the head from side to side.  Movement of the head and opening of the mouth when the corner of the mouth or cheek is stroked (rooting).  Increased sucking sounds, smacking lips, cooing, sighing, or squeaking.  Hand-to-mouth movements.  Increased sucking of fingers or hands. Late Signs of Hunger  Fussing.  Intermittent crying. Extreme Signs of Hunger Signs of extreme hunger will require calming and consoling before your baby will be able to breastfeed successfully. Do not wait for the following signs of extreme hunger to occur before you initiate breastfeeding:   Restlessness.  A loud, strong cry.   Screaming. BREASTFEEDING BASICS Breastfeeding Initiation  Find a comfortable place to sit or lie down, with your neck and back well supported.  Place a pillow or rolled up blanket under your baby to bring him or her to the level of your breast (if you are seated). Nursing pillows are specially designed to help  support your arms and your baby while you breastfeed.  Make sure that your baby's abdomen is facing your abdomen.   Gently massage your breast. With your fingertips, massage from your chest wall toward your nipple in a circular motion. This encourages milk flow. You may need to continue this action during the feeding if your milk flows slowly.  Support your breast with 4 fingers underneath and your thumb above your nipple. Make sure your fingers are well away from your nipple and your baby's mouth.   Stroke your baby's lips gently with your finger or nipple.   When your baby's mouth is   open wide enough, quickly bring your baby to your breast, placing your entire nipple and as much of the colored area around your nipple (areola) as possible into your baby's mouth.   More areola should be visible above your baby's upper lip than below the lower lip.   Your baby's tongue should be between his or her lower gum and your breast.   Ensure that your baby's mouth is correctly positioned around your nipple (latched). Your baby's lips should create a seal on your breast and be turned out (everted).  It is common for your baby to suck about 2 3 minutes in order to start the flow of breast milk. Latching Teaching your baby how to latch on to your breast properly is very important. An improper latch can cause nipple pain and decreased milk supply for you and poor weight gain in your baby. Also, if your baby is not latched onto your nipple properly, he or she may swallow some air during feeding. This can make your baby fussy. Burping your baby when you switch breasts during the feeding can help to get rid of the air. However, teaching your baby to latch on properly is still the best way to prevent fussiness from swallowing air while breastfeeding. Signs that your baby has successfully latched on to your nipple:    Silent tugging or silent sucking, without causing you pain.   Swallowing heard  between every 3 4 sucks.    Muscle movement above and in front of his or her ears while sucking.  Signs that your baby has not successfully latched on to nipple:   Sucking sounds or smacking sounds from your baby while breastfeeding.  Nipple pain. If you think your baby has not latched on correctly, slip your finger into the corner of your baby's mouth to break the suction and place it between your baby's gums. Attempt breastfeeding initiation again. Signs of Successful Breastfeeding Signs from your baby:   A gradual decrease in the number of sucks or complete cessation of sucking.   Falling asleep.   Relaxation of his or her body.   Retention of a small amount of milk in his or her mouth.   Letting go of your breast by himself or herself. Signs from you:  Breasts that have increased in firmness, weight, and size 1 3 hours after feeding.   Breasts that are softer immediately after breastfeeding.  Increased milk volume, as well as a change in milk consistency and color by the 5th day of breastfeeding.   Nipples that are not sore, cracked, or bleeding. Signs That Your Baby is Getting Enough Milk  Wetting at least 3 diapers in a 24-hour period. The urine should be clear and pale yellow by age 5 days.  At least 3 stools in a 24-hour period by age 5 days. The stool should be soft and yellow.  At least 3 stools in a 24-hour period by age 7 days. The stool should be seedy and yellow.  No loss of weight greater than 10% of birth weight during the first 3 days of age.  Average weight gain of 4 7 ounces (120 210 mL) per week after age 4 days.  Consistent daily weight gain by age 5 days, without weight loss after the age of 2 weeks. After a feeding, your baby may spit up a small amount. This is common. BREASTFEEDING FREQUENCY AND DURATION Frequent feeding will help you make more milk and can prevent sore nipples and breast engorgement.   Breastfeed when you feel the need to  reduce the fullness of your breasts or when your baby shows signs of hunger. This is called "breastfeeding on demand." Avoid introducing a pacifier to your baby while you are working to establish breastfeeding (the first 4 6 weeks after your baby is born). After this time you may choose to use a pacifier. Research has shown that pacifier use during the first year of a baby's life decreases the risk of sudden infant death syndrome (SIDS). Allow your baby to feed on each breast as long as he or she wants. Breastfeed until your baby is finished feeding. When your baby unlatches or falls asleep while feeding from the first breast, offer the second breast. Because newborns are often sleepy in the first few weeks of life, you may need to awaken your baby to get him or her to feed. Breastfeeding times will vary from baby to baby. However, the following rules can serve as a guide to help you ensure that your baby is properly fed:  Newborns (babies 4 weeks of age or younger) may breastfeed every 1 3 hours.  Newborns should not go longer than 3 hours during the day or 5 hours during the night without breastfeeding.  You should breastfeed your baby a minimum of 8 times in a 24-hour period until you begin to introduce solid foods to your baby at around 6 months of age. BREAST MILK PUMPING Pumping and storing breast milk allows you to ensure that your baby is exclusively fed your breast milk, even at times when you are unable to breastfeed. This is especially important if you are going back to work while you are still breastfeeding or when you are not able to be present during feedings. Your lactation consultant can give you guidelines on how long it is safe to store breast milk.  A breast pump is a machine that allows you to pump milk from your breast into a sterile bottle. The pumped breast milk can then be stored in a refrigerator or freezer. Some breast pumps are operated by hand, while others use electricity. Ask  your lactation consultant which type will work best for you. Breast pumps can be purchased, but some hospitals and breastfeeding support groups lease breast pumps on a monthly basis. A lactation consultant can teach you how to hand express breast milk, if you prefer not to use a pump.  CARING FOR YOUR BREASTS WHILE YOU BREASTFEED Nipples can become dry, cracked, and sore while breastfeeding. The following recommendations can help keep your breasts moisturized and healthy:  Avoid using soap on your nipples.   Wear a supportive bra. Although not required, special nursing bras and tank tops are designed to allow access to your breasts for breastfeeding without taking off your entire bra or top. Avoid wearing underwire style bras or extremely tight bras.  Air dry your nipples for 3 4minutes after each feeding.   Use only cotton bra pads to absorb leaked breast milk. Leaking of breast milk between feedings is normal.   Use lanolin on your nipples after breastfeeding. Lanolin helps to maintain your skin's normal moisture barrier. If you use pure lanolin you do not need to wash it off before feeding your baby again. Pure lanolin is not toxic to your baby. You may also hand express a few drops of breast milk and gently massage that milk into your nipples and allow the milk to air dry. In the first few weeks after giving birth, some women   experience extremely full breasts (engorgement). Engorgement can make your breasts feel heavy, warm, and tender to the touch. Engorgement peaks within 3 5 days after you give birth. The following recommendations can help ease engorgement:  Completely empty your breasts while breastfeeding or pumping. You may want to start by applying warm, moist heat (in the shower or with warm water-soaked hand towels) just before feeding or pumping. This increases circulation and helps the milk flow. If your baby does not completely empty your breasts while breastfeeding, pump any extra  milk after he or she is finished.  Wear a snug bra (nursing or regular) or tank top for 1 2 days to signal your body to slightly decrease milk production.  Apply ice packs to your breasts, unless this is too uncomfortable for you.  Make sure that your baby is latched on and positioned properly while breastfeeding. If engorgement persists after 48 hours of following these recommendations, contact your health care provider or a lactation consultant. OVERALL HEALTH CARE RECOMMENDATIONS WHILE BREASTFEEDING  Eat healthy foods. Alternate between meals and snacks, eating 3 of each per day. Because what you eat affects your breast milk, some of the foods may make your baby more irritable than usual. Avoid eating these foods if you are sure that they are negatively affecting your baby.  Drink milk, fruit juice, and water to satisfy your thirst (about 10 glasses a day).   Rest often, relax, and continue to take your prenatal vitamins to prevent fatigue, stress, and anemia.  Continue breast self-awareness checks.  Avoid chewing and smoking tobacco.  Avoid alcohol and drug use. Some medicines that may be harmful to your baby can pass through breast milk. It is important to ask your health care provider before taking any medicine, including all over-the-counter and prescription medicine as well as vitamin and herbal supplements. It is possible to become pregnant while breastfeeding. If birth control is desired, ask your health care provider about options that will be safe for your baby. SEEK MEDICAL CARE IF:   You feel like you want to stop breastfeeding or have become frustrated with breastfeeding.  You have painful breasts or nipples.  Your nipples are cracked or bleeding.  Your breasts are red, tender, or warm.  You have a swollen area on either breast.  You have a fever or chills.  You have nausea or vomiting.  You have drainage other than breast milk from your nipples.  Your breasts  do not become full before feedings by the 5th day after you give birth.  You feel sad and depressed.  Your baby is too sleepy to eat well.  Your baby is having trouble sleeping.   Your baby is wetting less than 3 diapers in a 24-hour period.  Your baby has less than 3 stools in a 24-hour period.  Your baby's skin or the white part of his or her eyes becomes yellow.   Your baby is not gaining weight by 5 days of age. SEEK IMMEDIATE MEDICAL CARE IF:   Your baby is overly tired (lethargic) and does not want to wake up and feed.  Your baby develops an unexplained fever. Document Released: 01/21/2005 Document Revised: 09/23/2012 Document Reviewed: 07/15/2012 [employer] Patient Information 2014 [employer].  

## 2013-04-13 NOTE — Progress Notes (Signed)
Post dates--schedule IOL at 41 1/2 wks if she tolerates continuation NST reviewed and reactive. Membranes stripped

## 2013-04-16 ENCOUNTER — Telehealth (HOSPITAL_COMMUNITY): Payer: Self-pay | Admitting: *Deleted

## 2013-04-16 ENCOUNTER — Encounter: Payer: Self-pay | Admitting: Obstetrics and Gynecology

## 2013-04-16 ENCOUNTER — Ambulatory Visit (INDEPENDENT_AMBULATORY_CARE_PROVIDER_SITE_OTHER): Payer: BC Managed Care – PPO | Admitting: Obstetrics and Gynecology

## 2013-04-16 ENCOUNTER — Encounter (HOSPITAL_COMMUNITY): Payer: Self-pay | Admitting: *Deleted

## 2013-04-16 VITALS — BP 109/78 | Wt 155.0 lb

## 2013-04-16 DIAGNOSIS — Z3400 Encounter for supervision of normal first pregnancy, unspecified trimester: Secondary | ICD-10-CM

## 2013-04-16 DIAGNOSIS — O480 Post-term pregnancy: Secondary | ICD-10-CM

## 2013-04-16 DIAGNOSIS — O09519 Supervision of elderly primigravida, unspecified trimester: Secondary | ICD-10-CM

## 2013-04-16 NOTE — Telephone Encounter (Signed)
Preadmission screen  

## 2013-04-16 NOTE — Progress Notes (Signed)
[id number]

## 2013-04-16 NOTE — Progress Notes (Signed)
Patient is doing well without complaints. FM/labor precautions reviewed. Patient desires to be induced as soon as possible. Informed patient that induction of labor will be scheduled at 41 weeks. Cervical exam unchanged. Membranes stripped. AFI NST reviewed and reactive

## 2013-04-19 ENCOUNTER — Inpatient Hospital Stay (HOSPITAL_COMMUNITY)
Admission: RE | Admit: 2013-04-19 | Discharge: 2013-04-22 | DRG: 775 | Disposition: A | Discharge status: Home / Self Care | Payer: BC Managed Care – PPO | Source: Ambulatory Visit | Attending: Family Medicine | Admitting: Family Medicine

## 2013-04-19 ENCOUNTER — Encounter (HOSPITAL_COMMUNITY): Payer: Self-pay

## 2013-04-19 VITALS — BP 107/53 | HR 43 | Temp 98.2°F | Resp 16 | Ht 66.0 in | Wt 155.0 lb

## 2013-04-19 DIAGNOSIS — Z3490 Encounter for supervision of normal pregnancy, unspecified, unspecified trimester: Secondary | ICD-10-CM

## 2013-04-19 DIAGNOSIS — O480 Post-term pregnancy: Principal | ICD-10-CM | POA: Diagnosis present

## 2013-04-19 DIAGNOSIS — O09519 Supervision of elderly primigravida, unspecified trimester: Secondary | ICD-10-CM | POA: Diagnosis present

## 2013-04-19 LAB — CBC
HCT: 34.5 % — ABNORMAL LOW (ref 36.0–46.0)
Hemoglobin: 12.3 g/dL (ref 12.0–15.0)
MCH: 31.8 pg (ref 26.0–34.0)
MCHC: 35.7 g/dL (ref 30.0–36.0)
MCV: 89.1 fL (ref 78.0–100.0)
PLATELETS: 160 10*3/uL (ref 150–400)
RBC: 3.87 MIL/uL (ref 3.87–5.11)
RDW: 12.8 % (ref 11.5–15.5)
WBC: 5.7 10*3/uL (ref 4.0–10.5)

## 2013-04-19 MED ORDER — IBUPROFEN 600 MG PO TABS
600.0000 mg | ORAL_TABLET | Freq: Four times a day (QID) | ORAL | Status: DC | PRN
  Administered 2013-04-21: 600 mg via ORAL
  Filled 2013-04-19: qty 1

## 2013-04-19 MED ORDER — OXYTOCIN 40 UNITS IN LACTATED RINGERS INFUSION - SIMPLE MED: 62.5000 mL/h | INTRAVENOUS | Status: DC

## 2013-04-19 MED ORDER — ZOLPIDEM TARTRATE 5 MG PO TABS
5.0000 mg | ORAL_TABLET | Freq: Every evening | ORAL | Status: DC | PRN
  Administered 2013-04-19: 5 mg via ORAL
  Filled 2013-04-19: qty 1

## 2013-04-19 MED ORDER — CITRIC ACID-SODIUM CITRATE 334-500 MG/5ML PO SOLN: 30.0000 mL | ORAL | Status: DC | PRN

## 2013-04-19 MED ORDER — LACTATED RINGERS IV SOLN
500.0000 mL | INTRAVENOUS | Status: DC | PRN
  Administered 2013-04-20: 1000 mL via INTRAVENOUS

## 2013-04-19 MED ORDER — ACETAMINOPHEN 325 MG PO TABS: 650.0000 mg | ORAL_TABLET | ORAL | Status: DC | PRN

## 2013-04-19 MED ORDER — FENTANYL CITRATE 0.05 MG/ML IJ SOLN
100.0000 ug | INTRAMUSCULAR | Status: DC | PRN
  Administered 2013-04-20 (×2): 100 ug via INTRAVENOUS
  Filled 2013-04-19 (×2): qty 2

## 2013-04-19 MED ORDER — LACTATED RINGERS IV SOLN
INTRAVENOUS | Status: DC
  Administered 2013-04-19 – 2013-04-20 (×5): via INTRAVENOUS

## 2013-04-19 MED ORDER — MISOPROSTOL 25 MCG QUARTER TABLET
25.0000 ug | ORAL_TABLET | Freq: Once | ORAL | Status: AC
  Administered 2013-04-19: 25 ug via VAGINAL
  Filled 2013-04-19: qty 0.25

## 2013-04-19 MED ORDER — MISOPROSTOL 25 MCG QUARTER TABLET
25.0000 ug | ORAL_TABLET | ORAL | Status: DC | PRN
  Administered 2013-04-20: 25 ug via VAGINAL
  Filled 2013-04-19: qty 1
  Filled 2013-04-19: qty 0.25

## 2013-04-19 MED ORDER — ONDANSETRON HCL 4 MG/2ML IJ SOLN: 4.0000 mg | Freq: Four times a day (QID) | INTRAMUSCULAR | Status: DC | PRN

## 2013-04-19 MED ORDER — LIDOCAINE HCL (PF) 1 % IJ SOLN
30.0000 mL | INTRAMUSCULAR | Status: DC | PRN
  Administered 2013-04-21: 30 mL via SUBCUTANEOUS
  Filled 2013-04-19: qty 30

## 2013-04-19 MED ORDER — OXYTOCIN BOLUS FROM INFUSION
500.0000 mL | INTRAVENOUS | Status: DC
  Administered 2013-04-21: 500 mL via INTRAVENOUS

## 2013-04-19 MED ORDER — OXYCODONE-ACETAMINOPHEN 5-325 MG PO TABS: 1.0000 | ORAL_TABLET | ORAL | Status: DC | PRN

## 2013-04-19 MED ORDER — TERBUTALINE SULFATE 1 MG/ML IJ SOLN: 0.2500 mg | Freq: Once | INTRAMUSCULAR | Status: AC | PRN

## 2013-04-19 NOTE — H&P (Signed)
Margaret Rosario is a 38 y.o. female presenting for IOL for post-dates. Denies VB, regular ctx, LOF. + FM.    Maternal Medical History:  Reason for admission: Nausea.    OB History   Grav Para Term Preterm Abortions TAB SAB Ect Mult Living   1         0     Past Medical History  Diagnosis Date  . AMA (advanced maternal age) multigravida 35+    Past Surgical History  Procedure Laterality Date  . Knee arthroscopy Left   . Femoral hernia repair    . Orif finger / thumb fracture     Family History: family history includes Cancer in her maternal grandfather; Luiz Blare' disease in her brother. Social History:  reports that she has never smoked. She has never used smokeless tobacco. She reports that she drinks about 1.8 ounces of alcohol per week. She reports that she does not use illicit drugs.   Prenatal Transfer Tool  Maternal Diabetes: No Genetic Screening: Normal Maternal Ultrasounds/Referrals: Abnormal:  Findings:   Other:  low-lying placenta - resolved @32wks  Fetal Ultrasounds or other Referrals:  Other:  Mircocephaly, HC of 8%; Maternal Substance Abuse:  No Significant Maternal Medications:  None Significant Maternal Lab Results:  Lab values include: Group B Strep negative Other Comments:  AMA  Review of Systems  Constitutional: Negative for fever and chills.  Eyes: Negative for blurred vision.  Respiratory: Negative for shortness of breath.   Cardiovascular: Negative for chest pain.  Gastrointestinal: Negative for nausea, vomiting and abdominal pain.  Neurological: Negative for headaches.    Dilation: 1 Effacement (%): 60 Station: -3 Exam by::  (Dr. Maggie Font) Blood pressure 104/58, pulse 55, temperature 98.1 F (36.7 C), temperature source Oral, resp. rate 18, height 5\' 6"  (1.676 m), weight 70.308 kg (155 lb), last menstrual period 07/06/2012. Maternal Exam:  Uterine Assessment: Contraction strength is mild.  Contraction frequency is rare.   Cervix: Cervix  evaluated by digital exam.     Fetal Exam Fetal Monitor Review: Baseline rate: 160.  Variability: marked (>25 bpm).   Pattern: accelerations present and no decelerations.    Fetal State Assessment: Category II - tracings are indeterminate.     Physical Exam  Constitutional: She is oriented to person, place, and time. She appears well-developed and well-nourished. No distress.  HENT:  Head: Normocephalic and atraumatic.  Cardiovascular: Normal rate and regular rhythm.   Respiratory: Effort normal and breath sounds normal.  GI: There is no tenderness. There is no rebound and no guarding.  Musculoskeletal: She exhibits edema.  Trace LE edema  Neurological: She is alert and oriented to person, place, and time.  Skin: Skin is warm and dry.  Psychiatric: Her behavior is normal.    Prenatal labs: ABO, Rh: A/POS/-- (07/17 1122) Antibody: NEG (07/17 1122) Rubella: 1.45 (01/20 1335) RPR: NON REAC (01/20 1335)  HBsAg: NEGATIVE (07/17 1122)  HIV: NON REACTIVE (12/04 1112)  GBS: Negative (02/13 0000)   Low lying palcenta resolved @32  wk Microcephaly - neg torch 3% @ 32 wk, improved to 8%  Assessment/Plan: 38 y.o. G1P0 @ [redacted]w[redacted]d presents for IOL for postdates  #Labor: IOL/ low bishop score, cervical ripening. placed cytotec; continue active management, expect SVD. Avoid vacuum if able #Pain: Fentanyl #FWB: Category II; questionable tachycardia, moderate to marked variability. frequent accels, no decels, will give fluid bolus #ID:  GBS neg #Feeding: Breast #MOC: Condoms  #Gender unknown - if female would like circ (likely female by [place])  Michaelene SongHall, Jonathan C 04/19/2013, 10:12 PM  I spoke with and examined patient and agree with resident's note and plan of care.  Tawana ScaleMichael Ryan Delorice Bannister, MD OB Fellow 04/19/2013 11:11 PM

## 2013-04-20 ENCOUNTER — Encounter (HOSPITAL_COMMUNITY): Payer: Self-pay

## 2013-04-20 ENCOUNTER — Inpatient Hospital Stay (HOSPITAL_COMMUNITY): Payer: BC Managed Care – PPO | Admitting: Anesthesiology

## 2013-04-20 ENCOUNTER — Encounter (HOSPITAL_COMMUNITY): Payer: BC Managed Care – PPO | Admitting: Anesthesiology

## 2013-04-20 DIAGNOSIS — O480 Post-term pregnancy: Secondary | ICD-10-CM

## 2013-04-20 DIAGNOSIS — O09519 Supervision of elderly primigravida, unspecified trimester: Secondary | ICD-10-CM

## 2013-04-20 LAB — RPR: RPR: NONREACTIVE

## 2013-04-20 MED ORDER — EPHEDRINE 5 MG/ML INJ
INTRAVENOUS | Status: AC
  Filled 2013-04-20: qty 4

## 2013-04-20 MED ORDER — LACTATED RINGERS IV SOLN
500.0000 mL | Freq: Once | INTRAVENOUS | Status: AC
  Administered 2013-04-20: 1000 mL via INTRAVENOUS

## 2013-04-20 MED ORDER — EPHEDRINE 5 MG/ML INJ
10.0000 mg | INTRAVENOUS | Status: DC | PRN
  Filled 2013-04-20: qty 2

## 2013-04-20 MED ORDER — PHENYLEPHRINE 40 MCG/ML (10ML) SYRINGE FOR IV PUSH (FOR BLOOD PRESSURE SUPPORT)
80.0000 ug | PREFILLED_SYRINGE | INTRAVENOUS | Status: DC | PRN
  Filled 2013-04-20: qty 2

## 2013-04-20 MED ORDER — FENTANYL 2.5 MCG/ML BUPIVACAINE 1/10 % EPIDURAL INFUSION (WH - ANES)
14.0000 mL/h | INTRAMUSCULAR | Status: DC | PRN
  Administered 2013-04-20: 14 mL/h via EPIDURAL
  Filled 2013-04-20: qty 125

## 2013-04-20 MED ORDER — PHENYLEPHRINE 40 MCG/ML (10ML) SYRINGE FOR IV PUSH (FOR BLOOD PRESSURE SUPPORT)
PREFILLED_SYRINGE | INTRAVENOUS | Status: AC
  Filled 2013-04-20: qty 10

## 2013-04-20 MED ORDER — LIDOCAINE HCL (PF) 1 % IJ SOLN
INTRAMUSCULAR | Status: DC | PRN
  Administered 2013-04-20 (×2): 9 mL

## 2013-04-20 MED ORDER — FENTANYL 2.5 MCG/ML BUPIVACAINE 1/10 % EPIDURAL INFUSION (WH - ANES)
INTRAMUSCULAR | Status: DC | PRN
  Administered 2013-04-20: 14 mL/h via EPIDURAL

## 2013-04-20 MED ORDER — OXYTOCIN 40 UNITS IN LACTATED RINGERS INFUSION - SIMPLE MED
1.0000 m[IU]/min | INTRAVENOUS | Status: DC
  Administered 2013-04-20: 2 m[IU]/min via INTRAVENOUS
  Filled 2013-04-20: qty 1000

## 2013-04-20 MED ORDER — ZOLPIDEM TARTRATE 5 MG PO TABS: 5.0000 mg | ORAL_TABLET | Freq: Every evening | ORAL | Status: DC | PRN

## 2013-04-20 MED ORDER — FENTANYL 2.5 MCG/ML BUPIVACAINE 1/10 % EPIDURAL INFUSION (WH - ANES)
INTRAMUSCULAR | Status: AC
  Filled 2013-04-20: qty 125

## 2013-04-20 MED ORDER — DIPHENHYDRAMINE HCL 50 MG/ML IJ SOLN: 12.5000 mg | INTRAMUSCULAR | Status: DC | PRN

## 2013-04-20 NOTE — Progress Notes (Signed)
Margaret CaveMichelle D Rosario is a 38 y.o. G1P0000 at 1730w1d admitted for induction of labor due to Post dates.   Subjective: Reports some mild cramping with epidural working well.   Objective: BP 110/77  Pulse 51  Temp(Src) 98.6 F (37 C) (Oral)  Resp 20  Ht 5\' 6"  (1.676 m)  Wt 70.308 kg (155 lb)  BMI 25.03 kg/m2  SpO2 100%  LMP 07/06/2012 I/O last 3 completed shifts: In: -  Out: 150 [Urine:150] Total I/O In: -  Out: 100 [Urine:100]  FHT:  FHR: 135 bpm, variability: moderate,  accelerations:  Present,  decelerations:  Present occasional variables UC:   regular, every 3 minutes SVE:   Dilation: 10 Effacement (%): 100 Station: +2 (caput) Exam by:: Enis SlipperJane Bailey, RN  Labs: Lab Results  Component Value Date   WBC 5.7 04/19/2013   HGB 12.3 04/19/2013   HCT 34.5* 04/19/2013   MCV 89.1 04/19/2013   PLT 160 04/19/2013    Assessment / Plan: Spontaneous labor, progressing normally Cervix 10 cm x3 hours, no urge to push at this time  Labor: Progressing normally and plan to start pushing now.  Pitocin increased by 2 milliunits to aid descent/strengthen contractions. Preeclampsia:  n/a Fetal Wellbeing:  Category I and overall Category I with occasional Category II with variables Pain Control:  Epidural I/D:  n/a Anticipated MOD:  NSVD  LEFTWICH-KIRBY, Izora Benn 04/20/2013, 9:03 PM

## 2013-04-20 NOTE — Progress Notes (Signed)
   Subjective: Pt reports comfortable after epidural.  No questions or concerns.  Objective: BP 106/61  Pulse 49  Temp(Src) 97.6 F (36.4 C) (Oral)  Resp 18  Ht 5\' 6"  (1.676 m)  Wt 70.308 kg (155 lb)  BMI 25.03 kg/m2  SpO2 100%  LMP 07/06/2012      FHT:  FHR: 120's bpm, variability: moderate,  accelerations:  Present,  decelerations:  Present intermittent variables and early decelerations.  UC:   irregular, every 2-5 minutes SVE:   Dilation: 7 Effacement (%): 100 Station: -2 Exam by:: Enis SlipperJane Bailey, RN`  Labs: Lab Results  Component Value Date   WBC 5.7 04/19/2013   HGB 12.3 04/19/2013   HCT 34.5* 04/19/2013   MCV 89.1 04/19/2013   PLT 160 04/19/2013    Assessment / Plan: Induction of Labor, Progressing well  Labor: Progressing normally Begin pitocin augmentation Preeclampsia:  n/a Fetal Wellbeing:  Category II Pain Control:  Epidural I/D:  GBS neg Anticipated MOD:  NSVD  [facility] Amb[facility] Endoscopy CenterMUHAMMAD,Margaret Rosario 04/20/2013, 3:31 PM

## 2013-04-20 NOTE — Anesthesia Preprocedure Evaluation (Signed)
Anesthesia Evaluation  Patient identified by MRN, date of birth, ID band Patient awake    Reviewed: Allergy & Precautions, H&P , NPO status , Patient's Chart, lab work & pertinent test results  Airway Mallampati: II TM Distance: >3 FB Neck ROM: full    Dental no notable dental hx.    Pulmonary neg pulmonary ROS,    Pulmonary exam normal       Cardiovascular negative cardio ROS      Neuro/Psych negative neurological ROS  negative psych ROS   GI/Hepatic negative GI ROS, Neg liver ROS,   Endo/Other  negative endocrine ROS  Renal/GU negative Renal ROS  negative genitourinary   Musculoskeletal negative musculoskeletal ROS (+)   Abdominal Normal abdominal exam  (+)   Peds  Hematology negative hematology ROS (+)   Anesthesia Other Findings   Reproductive/Obstetrics (+) Pregnancy                           Anesthesia Physical Anesthesia Plan  ASA: II  Anesthesia Plan: Epidural   Post-op Pain Management:    Induction:   Airway Management Planned:   Additional Equipment:   Intra-op Plan:   Post-operative Plan:   Informed Consent: I have reviewed the patients History and Physical, chart, labs and discussed the procedure including the risks, benefits and alternatives for the proposed anesthesia with the patient or authorized representative who has indicated his/her understanding and acceptance.     Plan Discussed with:   Anesthesia Plan Comments:         Anesthesia Quick Evaluation  

## 2013-04-20 NOTE — Progress Notes (Signed)
Margaret Rosario is a 38 y.o. G1P0 at 2659w1d by ultrasound admitted for induction of labor due to Post dates. Due date 3.9.15.  Subjective: Regular contractions. Increased pain s/p foley placement  Objective: BP 92/53  Pulse 55  Temp(Src) 98 F (36.7 C) (Oral)  Resp 20  Ht 5\' 6"  (1.676 m)  Wt 70.308 kg (155 lb)  BMI 25.03 kg/m2  LMP 07/06/2012     FHT:  FHR: 130 bpm, variability: moderate,  accelerations:  Present,  decelerations:  Absent UC:   regular, every 2 minutes SVE:   Dilation: 1.5 Effacement (%): 50;60 Station: -3 Exam by:: S Nix RN, Dr Margo AyeHall  Labs: Lab Results  Component Value Date   WBC 5.7 04/19/2013   HGB 12.3 04/19/2013   HCT 34.5* 04/19/2013   MCV 89.1 04/19/2013   PLT 160 04/19/2013    Assessment / Plan: IOL due to postdates  Labor: Foley bulb placed @ 6:40am s/p cytotec x2.  Preeclampsia:  no signs or symptoms of toxicity Fetal Wellbeing:  Category I Pain Control:  Fentanyl PRN; Consider Nubain in not providing lasting coverage I/D:  GBS neg Anticipated MOD:  NSVD  Margaret Rosario, Margaret Rosario 04/20/2013, 9:17 AM

## 2013-04-20 NOTE — Progress Notes (Addendum)
Leonides CaveMichelle D Katkowski is a 38 y.o. G1P0 at 2848w1d by ultrasound admitted for induction of labor due to Post dates. Due date 3.9.15.  Subjective: More regular contractions. Pain is not bad  Objective: BP 103/60  Pulse 49  Temp(Src) 98 F (36.7 C) (Oral)  Resp 20  Ht 5\' 6"  (1.676 m)  Wt 70.308 kg (155 lb)  BMI 25.03 kg/m2  LMP 07/06/2012      FHT:  FHR: 130 bpm, variability: moderate,  accelerations:  Present,  decelerations:  Absent UC:   regular, every 2 minutes SVE:   Dilation: 1.5 Effacement (%): 60 Station: -3 Exam by:: S Nix RN  Labs: Lab Results  Component Value Date   WBC 5.7 04/19/2013   HGB 12.3 04/19/2013   HCT 34.5* 04/19/2013   MCV 89.1 04/19/2013   PLT 160 04/19/2013    Assessment / Plan: IOL due to postdates  Labor: s/p cytotec x2. Ctx more regular but too frequent for 2nd dose, but cervix without significant change. Will place foley bulb. Preeclampsia:  no signs or symptoms of toxicity Fetal Wellbeing:  Category I Pain Control:  Fentanyl PRN I/D:  GBS neg Anticipated MOD:  NSVD  Michaelene SongHall, Jonathan C 04/20/2013, 6:41 AM  I placed FB [id number] I spoke with and examined patient and agree with resident's note and plan of care.  Tawana ScaleMichael Ryan Wilbon Obenchain, MD OB Fellow 04/20/2013 7:02 AM

## 2013-04-20 NOTE — Progress Notes (Signed)
Margaret Rosario is a 38 y.o. G1P0 at 966w1d by ultrasound admitted for induction of labor due to Post dates. Due date 3.9.15.  Subjective: Still sleeping soundly.    Objective: BP 104/56  Pulse 49  Temp(Src) 98.4 F (36.9 C) (Oral)  Resp 18  Ht 5\' 6"  (1.676 m)  Wt 70.308 kg (155 lb)  BMI 25.03 kg/m2  LMP 07/06/2012      FHT:  FHR: 150 bpm, variability: moderate,  accelerations:  Present,  decelerations:  Absent UC:   regular, every 2-3 minutes SVE:   Dilation: 1.5 Effacement (%): 60 Station: -3 Exam by:: S Nix RN  Labs: Lab Results  Component Value Date   WBC 5.7 04/19/2013   HGB 12.3 04/19/2013   HCT 34.5* 04/19/2013   MCV 89.1 04/19/2013   PLT 160 04/19/2013    Assessment / Plan: IOL for postdates, progressing on cytotec. FHT reassuring   Labor: Progressing on cytotec; 2nd dose 2:30. Active management Preeclampsia:  no signs or symptoms of toxicity Fetal Wellbeing:  Category I Pain Control:  Fentanyl I/D:  GBS neg Anticipated MOD:  NSVD  Michaelene SongHall, Mordche Hedglin C 04/20/2013, 4:01 AM

## 2013-04-20 NOTE — Anesthesia Procedure Notes (Signed)
Epidural Patient location during procedure: OB Start time: 04/20/2013 12:04 PM End time: 04/20/2013 12:08 PM  Staffing Anesthesiologist: Leilani AbleHATCHETT, Arish Redner Performed by: anesthesiologist   Preanesthetic Checklist Completed: patient identified, surgical consent, pre-op evaluation, timeout performed, IV checked, risks and benefits discussed and monitors and equipment checked  Epidural Patient position: sitting Prep: site prepped and draped and DuraPrep Patient monitoring: continuous pulse ox and blood pressure Approach: midline Location: L3-L4 Injection technique: LOR air  Needle:  Needle type: Tuohy  Needle gauge: 17 G Needle length: 9 cm and 9 Needle insertion depth: 4 cm Catheter type: closed end flexible Catheter size: 19 Gauge Catheter at skin depth: 9 cm Test dose: negative and Other  Assessment Sensory level: T9 Events: blood not aspirated, injection not painful, no injection resistance, negative IV test and no paresthesia  Additional Notes Reason for block:procedure for pain

## 2013-04-20 NOTE — Progress Notes (Signed)
Subjective: Pt reports comfortable after epidural.  No questions or concerns.  Objective: BP 119/69  Pulse 48  Temp(Src) 98.3 F (36.8 C) (Oral)  Resp 20  Ht 5\' 6"  (1.676 m)  Wt 70.308 kg (155 lb)  BMI 25.03 kg/m2  SpO2 100%  LMP 07/06/2012   Total I/O In: -  Out: 150 [Urine:150]  FHT:  FHR: 150 bpm, variability: moderate,  accelerations:  Present,  decelerations:  Present occassional intermittent variables and early decelerations.  UC:   irregular, every 2-5 minutes SVE:   Dilation: 10 Effacement (%): 100 Station: +2 Exam by:: Enis SlipperJane Bailey, RN`  Labs: Lab Results  Component Value Date   WBC 5.7 04/19/2013   HGB 12.3 04/19/2013   HCT 34.5* 04/19/2013   MCV 89.1 04/19/2013   PLT 160 04/19/2013    Assessment / Plan: Induction of Labor, Progressing well  Labor: Progressing normally Begin pitocin augmentation Preeclampsia:  n/a Fetal Wellbeing:  Category II Pain Control:  Epidural I/D:  GBS neg Anticipated MOD:  NSVD  [name], Azaan Leask 04/20/2013, 5:22 PM

## 2013-04-20 NOTE — Progress Notes (Signed)
Margaret Rosario is a 38 y.o. G1P0 at 2870w1d by ultrasound admitted for induction of labor due to Post dates. Due date 3.9.15.  Subjective: Regular contractions. Increased pain after SROM. Pain much improved now after Epidural.   Objective: BP 105/69  Pulse 55  Temp(Src) 97.6 F (36.4 C) (Oral)  Resp 20  Ht 5\' 6"  (1.676 m)  Wt 70.308 kg (155 lb)  BMI 25.03 kg/m2  SpO2 100%  LMP 07/06/2012     FHT:  FHR: 130 bpm, variability: moderate,  accelerations:  Present,  decelerations:  Absent UC:   regular, every 2 minutes SVE:   Dilation: 2 Effacement (%): 80;90 Station: -1 Exam by:: Margaret SlipperJane Bailey, RN  Labs: Lab Results  Component Value Date   WBC 5.7 04/19/2013   HGB 12.3 04/19/2013   HCT 34.5* 04/19/2013   MCV 89.1 04/19/2013   PLT 160 04/19/2013    Assessment / Plan: IOL due to postdates  Labor: SROM @ ~ 11 am;  Foley bulb placed @ 6:40am s/p cytotec x2.  Preeclampsia:  no signs or symptoms of toxicity Fetal Wellbeing:  Category I Pain Control:  Epidural  I/D:  GBS neg Anticipated MOD:  NSVD  Margaret Rosario, Margaret Rosario 04/20/2013, 1:57 PM

## 2013-04-20 NOTE — Progress Notes (Signed)
Margaret Rosario is a 38 y.o. G1P0 at 6960w1d by ultrasound admitted for induction of labor due to Post dates. Due date 3.9.15.  Subjective: Sleeping soundly.    Objective: BP 104/58  Pulse 55  Temp(Src) 98.1 F (36.7 C) (Oral)  Resp 18  Ht 5\' 6"  (1.676 m)  Wt 70.308 kg (155 lb)  BMI 25.03 kg/m2  LMP 07/06/2012      FHT:  FHR: 150 bpm, variability: moderate,  accelerations:  Present,  decelerations:  Absent UC:   regular, every 8 minutes SVE:   Dilation: 1 Effacement (%): 60 Station: -3 Exam by:: Doreatha MassedF. Morris, RNC  Labs: Lab Results  Component Value Date   WBC 5.7 04/19/2013   HGB 12.3 04/19/2013   HCT 34.5* 04/19/2013   MCV 89.1 04/19/2013   PLT 160 04/19/2013    Assessment / Plan: IOL for postdates, progressing on cytotec. FHT reassuring with baseline 150s s/p 500mL bolus,   Labor: Progressing on cytotec; continue with cervical ripening. Active management Preeclampsia:  no signs or symptoms of toxicity Fetal Wellbeing:  Category I Pain Control:  Fentanyl I/D:  GBS neg Anticipated MOD:  NSVD  Michaelene SongHall, Tammi Boulier C 04/20/2013, 12:50 AM

## 2013-04-21 ENCOUNTER — Encounter (HOSPITAL_COMMUNITY): Payer: Self-pay

## 2013-04-21 MED ORDER — DIPHENHYDRAMINE HCL 25 MG PO CAPS: 25.0000 mg | ORAL_CAPSULE | Freq: Four times a day (QID) | ORAL | Status: DC | PRN

## 2013-04-21 MED ORDER — ONDANSETRON HCL 4 MG PO TABS: 4.0000 mg | ORAL_TABLET | ORAL | Status: DC | PRN

## 2013-04-21 MED ORDER — SIMETHICONE 80 MG PO CHEW: 80.0000 mg | CHEWABLE_TABLET | ORAL | Status: DC | PRN

## 2013-04-21 MED ORDER — ONDANSETRON HCL 4 MG/2ML IJ SOLN: 4.0000 mg | INTRAMUSCULAR | Status: DC | PRN

## 2013-04-21 MED ORDER — SENNOSIDES-DOCUSATE SODIUM 8.6-50 MG PO TABS
2.0000 | ORAL_TABLET | ORAL | Status: DC
  Filled 2013-04-21: qty 2

## 2013-04-21 MED ORDER — TETANUS-DIPHTH-ACELL PERTUSSIS 5-2.5-18.5 LF-MCG/0.5 IM SUSP: 0.5000 mL | Freq: Once | INTRAMUSCULAR | Status: DC

## 2013-04-21 MED ORDER — DIBUCAINE 1 % RE OINT: 1.0000 "application " | TOPICAL_OINTMENT | RECTAL | Status: DC | PRN

## 2013-04-21 MED ORDER — LANOLIN HYDROUS EX OINT: TOPICAL_OINTMENT | CUTANEOUS | Status: DC | PRN

## 2013-04-21 MED ORDER — PRENATAL MULTIVITAMIN CH: 1.0000 | ORAL_TABLET | Freq: Every day | ORAL | Status: DC

## 2013-04-21 MED ORDER — BENZOCAINE-MENTHOL 20-0.5 % EX AERO
1.0000 "application " | INHALATION_SPRAY | CUTANEOUS | Status: DC | PRN
  Administered 2013-04-21: 1 via TOPICAL
  Filled 2013-04-21: qty 56

## 2013-04-21 MED ORDER — PRENATAL MULTIVITAMIN CH
1.0000 | ORAL_TABLET | Freq: Every day | ORAL | Status: DC
  Administered 2013-04-21: 1 via ORAL
  Filled 2013-04-21: qty 1

## 2013-04-21 MED ORDER — IBUPROFEN 600 MG PO TABS
600.0000 mg | ORAL_TABLET | Freq: Four times a day (QID) | ORAL | Status: DC
  Administered 2013-04-21 (×2): 600 mg via ORAL
  Filled 2013-04-21 (×3): qty 1

## 2013-04-21 MED ORDER — OXYCODONE-ACETAMINOPHEN 5-325 MG PO TABS: 1.0000 | ORAL_TABLET | ORAL | Status: DC | PRN

## 2013-04-21 MED ORDER — WITCH HAZEL-GLYCERIN EX PADS: 1.0000 "application " | MEDICATED_PAD | CUTANEOUS | Status: DC | PRN

## 2013-04-21 MED ORDER — ZOLPIDEM TARTRATE 5 MG PO TABS: 5.0000 mg | ORAL_TABLET | Freq: Every evening | ORAL | Status: DC | PRN

## 2013-04-21 NOTE — Lactation Note (Signed)
This note was copied from the chart of Margaret Rosario. Lactation Consultation Note  Patient Name: Margaret Rosario Date: 04/21/2013 Reason for consult: Initial assessment  Infant easily latched in cross-cradle position on left breast; minimal assistance from [facility] with positioning of mom's hands for cross-cradle and asymetrical latching technique.  Taught mom and dad how to flange lips and assure depth.  LS-9; lots swallows heard throughout the feeding.  Infant has breastfed x4 (20-30 min) + 1 (5 min) feed since birth; 73 hrs old; voids-0; stools-49.  Taught hand expression with return demonstration and observation of colostrum.  Reviewed feeding cues and encouraged to feed with cues.  Parents brought in a Medela pump and wanted LC to show how to use it; reviewed milk pumping and cleaning of pump; discussed Medela's one-user recommendation for its pumps.  Parents requested a pump kit; pump kit given and charged. Lactation brochure given and informed of support group and lactation outpatient services.  Encouraged to call for assistance as needed.     Maternal Data Formula Feeding for Exclusion: No Infant to breast within first hour of birth: Yes Has patient been taught Hand Expression?: Yes Does the patient have breastfeeding experience prior to this delivery?: No  Feeding Feeding Type: Breast Fed Length of feed: 5 min  LATCH Score/Interventions Latch: Grasps breast easily, tongue down, lips flanged, rhythmical sucking. Intervention(s): Adjust position;Assist with latch;Breast compression  Audible Swallowing: Spontaneous and intermittent Intervention(s): Skin to skin  Type of Nipple: Everted at rest and after stimulation  Comfort (Breast/Nipple): Soft / non-tender     Hold (Positioning): Assistance needed to correctly position infant at breast and maintain latch. Intervention(s): Breastfeeding basics reviewed;Support Pillows;Position options;Skin to skin  LATCH  Score: 9  Lactation Tools Discussed/Used WIC Program: No Pump Review: Setup, frequency, and cleaning   Consult Status Consult Status: Follow-up Date: 04/22/13 Follow-up type: In-patient    Merlene Laughter 04/21/2013, 3:32 PM

## 2013-04-21 NOTE — Anesthesia Postprocedure Evaluation (Signed)
  Anesthesia Post-op Note  Patient: Margaret Rosario  Procedure(s) Performed: * No procedures listed *  Patient Location:   Anesthesia Type:Epidural  Level of Consciousness: awake, alert , oriented and patient cooperative  Airway and Oxygen Therapy: Patient Spontanous Breathing  Post-op Pain: mild  Post-op Assessment: Post-op Vital signs reviewed, Patient's Cardiovascular Status Stable, Respiratory Function Stable, Patent Airway, No signs of Nausea or vomiting, Adequate PO intake and Pain level controlled  Post-op Vital Signs: Reviewed and stable  Complications: No apparent anesthesia complications

## 2013-04-22 MED ORDER — IBUPROFEN 600 MG PO TABS: 600.0000 mg | ORAL_TABLET | Freq: Four times a day (QID) | ORAL | Status: DC

## 2013-04-22 NOTE — Discharge Instructions (Signed)

## 2013-04-22 NOTE — Discharge Summary (Signed)
Obstetric Discharge Summary Reason for Admission: induction of labor Prenatal Procedures: ultrasound Intrapartum Procedures: spontaneous vaginal delivery Postpartum Procedures: none Complications-Operative and Postpartum: 1st degree left labial tear, repaired Hemoglobin  Date Value Ref Range Status  04/19/2013 12.3  12.0 - 15.0 g/dL Final     HCT  Date Value Ref Range Status  04/19/2013 34.5* 36.0 - 46.0 % Final    Discharge Diagnoses: Post-date pregnancy  Hospital Course:  Margaret Rosario is a 38 y.o. G1P1001 who presented for IOL d/t postdates.  She had a uncomplicated SVD. She was able to ambulate, tolerate PO and void normally. She was discharged home with instructions for postpartum care.  Pt is considering mirena for contraception and will be using condoms. She will be breastfeeding.  Delivery Note  At 12:25 AM a viable female was delivered via Vaginal, Spontaneous Delivery (Presentation: Right Occiput Anterior). APGAR: 7, 9; weight 7 lb 8.5 oz (3415 g).  Placenta status: Intact, Spontaneous. Cord: 3 vessels with the following complications: None. Cord pH: not collected  Anesthesia: Epidural  Episiotomy: N/A  Lacerations: 1st degree;Labial  Suture Repair: 3.0 vicryl rapide  Est. Blood Loss (mL): 350  Mom to postpartum. Baby to Couplet care / Skin to Skin.  Ms. [name] had IOL for postdates. She received cytotec x2 and then pitocin. She progressed to active labor. She pushed and for SVD of viable girl. Light meconium, baby given to nurses under warmer after father cut cord. 1st degree L labial tear, repaired in the usual fashion. Hemostatic, counts correct x2. Continue routine post-partum care. Margaret Rosario was present and supervised entire delivery.  Michaelene SongHall, Jonathan C  04/21/2013, 1:05 AM   Physical Exam:  General: alert and cooperative Lochia: appropriate Uterine Fundus: firm DVT Evaluation: No evidence of DVT seen on physical exam.  Discharge  Information: Date: 04/22/2013 Activity: pelvic rest Diet: routine Medications: Ibuprofen Baby feeding: plans to breastfeed Contraception: condoms, IUD Condition: stable Instructions: refer to practice specific booklet Discharge to: home   Newborn Data: Live born female  Birth Weight: 7 lb 8.5 oz (3415 g) APGAR: 7, 9  Home with mother.  [name], PA-S  I have seen and examined this patient and I agree with the above. [name], KIMBERLY 8:58 AM 04/22/2013

## 2013-06-01 ENCOUNTER — Ambulatory Visit (INDEPENDENT_AMBULATORY_CARE_PROVIDER_SITE_OTHER): Payer: BC Managed Care – PPO | Admitting: Obstetrics & Gynecology

## 2013-06-01 ENCOUNTER — Encounter: Payer: Self-pay | Admitting: Obstetrics & Gynecology

## 2013-06-01 NOTE — Progress Notes (Signed)
   Subjective:    Patient ID: Margaret Rosario, female    DOB: Oct 17, 1975, 38 y.o.   MRN: [id number]  HPI  38 yo P1 now 6 weeks pp s/p NSVD, IOL for post dates. She has no problems. She and husband have taken off 12 week for pat/maternity leave. She plans to use condoms for contraception. She is having a small amount of GSUI, will do Kegels. Breast feeding without difficulty but plans to change to bottle when she goes to work. She denies depression symptoms. Baby is growing well.  Review of Systems     Objective:   Physical Exam  Well-healed vulva. Involuted uterus with normal adnexa      Assessment & Plan:  Post partum- doing well RTC 6 months for annual

## 2013-12-06 ENCOUNTER — Encounter: Payer: Self-pay | Admitting: Obstetrics & Gynecology

## 2014-05-31 ENCOUNTER — Ambulatory Visit
Admit: 2014-05-31 | Disposition: A | Discharge status: Home / Self Care | Payer: Self-pay | Attending: General Practice | Admitting: General Practice

## 2014-07-13 ENCOUNTER — Other Ambulatory Visit: Payer: Self-pay | Admitting: Internal Medicine

## 2014-07-13 DIAGNOSIS — R053 Chronic cough: Secondary | ICD-10-CM

## 2014-07-13 DIAGNOSIS — R05 Cough: Secondary | ICD-10-CM

## 2014-07-20 ENCOUNTER — Ambulatory Visit
Admission: RE | Admit: 2014-07-20 | Discharge: 2014-07-20 | Disposition: A | Discharge status: Home / Self Care | Payer: BLUE CROSS/BLUE SHIELD | Source: Ambulatory Visit | Attending: Internal Medicine | Admitting: Internal Medicine

## 2014-07-20 DIAGNOSIS — R0602 Shortness of breath: Secondary | ICD-10-CM | POA: Diagnosis not present

## 2014-07-20 DIAGNOSIS — R0989 Other specified symptoms and signs involving the circulatory and respiratory systems: Secondary | ICD-10-CM | POA: Diagnosis not present

## 2014-07-20 DIAGNOSIS — R05 Cough: Secondary | ICD-10-CM | POA: Insufficient documentation

## 2014-07-20 DIAGNOSIS — R053 Chronic cough: Secondary | ICD-10-CM

## 2014-07-20 MED ORDER — IOHEXOL 300 MG/ML  SOLN
75.0000 mL | Freq: Once | INTRAMUSCULAR | Status: AC | PRN
  Administered 2014-07-20: 75 mL via INTRAVENOUS

## 2018-11-03 ENCOUNTER — Ambulatory Visit: Payer: Self-pay

## 2018-11-03 DIAGNOSIS — Z23 Encounter for immunization: Secondary | ICD-10-CM

## 2018-12-17 DIAGNOSIS — Z03818 Encounter for observation for suspected exposure to other biological agents ruled out: Secondary | ICD-10-CM | POA: Diagnosis not present

## 2019-03-16 NOTE — Progress Notes (Signed)
Patient comes in today for pre physical labs and EKG. Patient is scheduled for a physical with Anette Riedel, PA-C on 03/25/2019.

## 2019-03-17 ENCOUNTER — Ambulatory Visit: Payer: Self-pay

## 2019-03-17 ENCOUNTER — Other Ambulatory Visit: Payer: Self-pay

## 2019-03-17 DIAGNOSIS — Z0000 Encounter for general adult medical examination without abnormal findings: Secondary | ICD-10-CM

## 2019-03-17 LAB — POCT URINALYSIS DIPSTICK
Bilirubin, UA: NEGATIVE
Blood, UA: NEGATIVE
Glucose, UA: NEGATIVE
Ketones, UA: NEGATIVE
Leukocytes, UA: NEGATIVE
Nitrite, UA: NEGATIVE
Protein, UA: NEGATIVE
Spec Grav, UA: 1.025 (ref 1.010–1.025)
Urobilinogen, UA: 0.2 E.U./dL
pH, UA: 6 (ref 5.0–8.0)

## 2019-03-18 LAB — CMP12+LP+TP+TSH+6AC+CBC/D/PLT
ALT: 22 IU/L (ref 0–32)
AST: 23 IU/L (ref 0–40)
Albumin/Globulin Ratio: 1.8 (ref 1.2–2.2)
Albumin: 4.2 g/dL (ref 3.8–4.8)
Alkaline Phosphatase: 39 IU/L (ref 39–117)
BUN/Creatinine Ratio: 15 (ref 9–23)
BUN: 13 mg/dL (ref 6–24)
Basophils Absolute: 0 10*3/uL (ref 0.0–0.2)
Basos: 1 %
Bilirubin Total: 0.7 mg/dL (ref 0.0–1.2)
Calcium: 9.1 mg/dL (ref 8.7–10.2)
Chloride: 106 mmol/L (ref 96–106)
Chol/HDL Ratio: 3.6 ratio (ref 0.0–4.4)
Cholesterol, Total: 180 mg/dL (ref 100–199)
Creatinine, Ser: 0.88 mg/dL (ref 0.57–1.00)
EOS (ABSOLUTE): 0.1 10*3/uL (ref 0.0–0.4)
Eos: 3 %
Estimated CHD Risk: 0.6 times avg. (ref 0.0–1.0)
Free Thyroxine Index: 1.9 (ref 1.2–4.9)
GFR calc Af Amer: 94 mL/min/{1.73_m2} (ref >59)
GFR calc non Af Amer: 81 mL/min/{1.73_m2} (ref >59)
GGT: 24 IU/L (ref 0–60)
Globulin, Total: 2.4 g/dL (ref 1.5–4.5)
Glucose: 98 mg/dL (ref 65–99)
HDL: 50 mg/dL (ref >39)
Hematocrit: 40.4 % (ref 34.0–46.6)
Hemoglobin: 14.1 g/dL (ref 11.1–15.9)
Immature Grans (Abs): 0 10*3/uL (ref 0.0–0.1)
Immature Granulocytes: 0 %
Iron: 79 ug/dL (ref 27–159)
LDH: 189 IU/L (ref 119–226)
LDL Chol Calc (NIH): 120 mg/dL — ABNORMAL HIGH (ref 0–99)
Lymphocytes Absolute: 1.9 10*3/uL (ref 0.7–3.1)
Lymphs: 43 %
MCH: 31.1 pg (ref 26.6–33.0)
MCHC: 34.9 g/dL (ref 31.5–35.7)
MCV: 89 fL (ref 79–97)
Monocytes Absolute: 0.5 10*3/uL (ref 0.1–0.9)
Monocytes: 11 %
Neutrophils Absolute: 1.9 10*3/uL (ref 1.4–7.0)
Neutrophils: 42 %
Phosphorus: 3.3 mg/dL (ref 3.0–4.3)
Platelets: 295 10*3/uL (ref 150–450)
Potassium: 4.4 mmol/L (ref 3.5–5.2)
RBC: 4.53 x10E6/uL (ref 3.77–5.28)
RDW: 11.7 % (ref 11.7–15.4)
Sodium: 142 mmol/L (ref 134–144)
T3 Uptake Ratio: 29 % (ref 24–39)
T4, Total: 6.7 ug/dL (ref 4.5–12.0)
TSH: 2.37 u[IU]/mL (ref 0.450–4.500)
Total Protein: 6.6 g/dL (ref 6.0–8.5)
Triglycerides: 53 mg/dL (ref 0–149)
Uric Acid: 6.2 mg/dL (ref 2.6–6.2)
VLDL Cholesterol Cal: 10 mg/dL (ref 5–40)
WBC: 4.4 10*3/uL (ref 3.4–10.8)

## 2019-04-06 ENCOUNTER — Ambulatory Visit: Payer: Self-pay | Admitting: Occupational Medicine

## 2019-04-06 ENCOUNTER — Other Ambulatory Visit: Payer: Self-pay

## 2019-04-06 VITALS — BP 107/79 | HR 64 | Temp 97.1°F | Ht 66.0 in | Wt 126.4 lb

## 2019-04-06 DIAGNOSIS — Z0000 Encounter for general adult medical examination without abnormal findings: Secondary | ICD-10-CM

## 2019-04-06 NOTE — Progress Notes (Signed)
   Subjective:    Patient ID: Margaret Rosario, female    DOB: [birth year], 44 y.o.   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  She is in excellent health and has no medical conditions.  She takes no medications.  She is in for her annual physical exam at this employer based health care clinic.  She has no complaints.  She is enjoying spending time with her 49-year-old daughter who is in [employer] kindergarten.  The pandemic has been difficult, but her family has been managing quite well.  She enjoys playing sports with her daughter and husband was also a Emergency planning/management officer.  She played basketball in college.   Review of Systems Negative    Objective:   Physical Exam  Today's Vitals   04/06/19 1111  BP: 107/79  Pulse: 64  Temp: (!) 97.1 F (36.2 C)  TempSrc: Oral  SpO2: 96%  Weight: 126 lb 6.4 oz (57.3 kg)  Height: 5\' 6"  (1.676 m)   Body mass index is 20.4 kg/m.  BP 107/79   Pulse 64   Temp (!) 97.1 F (36.2 C) (Oral)   Ht 5\' 6"  (1.676 m)   Wt 126 lb 6.4 oz (57.3 kg)   LMP 03/16/2019 (Approximate)   SpO2 96%   BMI 20.40 kg/m   General Appearance:    Alert, cooperative, no distress, appears stated age  Head:    Normocephalic, without obvious abnormality, atraumatic  Eyes:    PERRL, conjunctiva/corneas clear, EOM's intact, fundi    benign, both eyes  Ears:    Normal TM's and external ear canals, both ears  Nose:   Nares normal, septum midline, mucosa normal, no drainage    or sinus tenderness  Throat:   Lips, mucosa, and tongue normal; teeth and gums normal  Neck:   Supple, symmetrical, trachea midline, no adenopathy;    thyroid:  no enlargement/tenderness/nodules; no carotid   bruit or JVD  Back:     Symmetric, no curvature, ROM normal, no CVA tenderness  Lungs:     Clear to auscultation bilaterally, respirations unlabored  Chest Wall:    No tenderness or deformity   Heart:    Regular rate and rhythm, S1 and S2  normal, no murmur, rub   or gallop  Breast Exam:    No tenderness, masses, or nipple abnormality  Abdomen:     Soft, non-tender, bowel sounds active all four quadrants,    no masses, no organomegaly  Genitalia:  deferred  Rectal:    Extremities:   Extremities normal, atraumatic, no cyanosis or edema  Pulses:   2+ and symmetric all extremities  Skin:   Skin color, texture, turgor normal, no rashes or lesions  Lymph nodes:   Cervical, supraclavicular, and axillary nodes normal  Neurologic:   CNII-XII intact, normal strength, sensation and reflexes    throughout      Assessment & Plan:  Annual physical exam-no problems identified.  Blood work is normal.  Follow-up next year.  Advise discussion of mammogram with her OB/GYN.  She is overdue for Pap smear with OB/GYN and advised her to get this scheduled as well.  No other health problems identified.  Last tetanus shot was 2015 and she was a hepatitis the vaccine responder with positive titer in 2011

## 2019-05-24 DIAGNOSIS — X32XXXA Exposure to sunlight, initial encounter: Secondary | ICD-10-CM | POA: Diagnosis not present

## 2019-05-24 DIAGNOSIS — L821 Other seborrheic keratosis: Secondary | ICD-10-CM | POA: Diagnosis not present

## 2019-05-24 DIAGNOSIS — D2272 Melanocytic nevi of left lower limb, including hip: Secondary | ICD-10-CM | POA: Diagnosis not present

## 2019-05-24 DIAGNOSIS — D225 Melanocytic nevi of trunk: Secondary | ICD-10-CM | POA: Diagnosis not present

## 2019-05-24 DIAGNOSIS — D2271 Melanocytic nevi of right lower limb, including hip: Secondary | ICD-10-CM | POA: Diagnosis not present

## 2019-05-24 DIAGNOSIS — D2262 Melanocytic nevi of left upper limb, including shoulder: Secondary | ICD-10-CM | POA: Diagnosis not present

## 2019-05-24 DIAGNOSIS — L570 Actinic keratosis: Secondary | ICD-10-CM | POA: Diagnosis not present

## 2019-05-24 DIAGNOSIS — D485 Neoplasm of uncertain behavior of skin: Secondary | ICD-10-CM | POA: Diagnosis not present

## 2019-05-24 DIAGNOSIS — D2261 Melanocytic nevi of right upper limb, including shoulder: Secondary | ICD-10-CM | POA: Diagnosis not present

## 2019-07-20 ENCOUNTER — Other Ambulatory Visit: Payer: Self-pay

## 2019-07-20 DIAGNOSIS — Z1231 Encounter for screening mammogram for malignant neoplasm of breast: Secondary | ICD-10-CM

## 2019-07-22 ENCOUNTER — Other Ambulatory Visit: Payer: Self-pay | Admitting: Physician Assistant

## 2019-07-22 MED ORDER — ACYCLOVIR 400 MG PO TABS: 400.0000 mg | ORAL_TABLET | Freq: Every day | ORAL | 1 refills | Status: DC

## 2019-08-03 ENCOUNTER — Ambulatory Visit
Admission: RE | Admit: 2019-08-03 | Discharge: 2019-08-03 | Disposition: A | Discharge status: Home / Self Care | Payer: 59 | Source: Ambulatory Visit | Attending: Emergency Medicine | Admitting: Emergency Medicine

## 2019-08-03 DIAGNOSIS — Z1231 Encounter for screening mammogram for malignant neoplasm of breast: Secondary | ICD-10-CM | POA: Insufficient documentation

## 2019-08-05 ENCOUNTER — Other Ambulatory Visit: Payer: Self-pay | Admitting: Emergency Medicine

## 2019-08-05 DIAGNOSIS — R928 Other abnormal and inconclusive findings on diagnostic imaging of breast: Secondary | ICD-10-CM

## 2019-08-05 DIAGNOSIS — N6489 Other specified disorders of breast: Secondary | ICD-10-CM

## 2019-08-24 ENCOUNTER — Ambulatory Visit
Admission: RE | Admit: 2019-08-24 | Discharge: 2019-08-24 | Disposition: A | Discharge status: Home / Self Care | Payer: 59 | Source: Ambulatory Visit | Attending: Emergency Medicine | Admitting: Emergency Medicine

## 2019-08-24 DIAGNOSIS — R928 Other abnormal and inconclusive findings on diagnostic imaging of breast: Secondary | ICD-10-CM

## 2019-08-24 DIAGNOSIS — N6489 Other specified disorders of breast: Secondary | ICD-10-CM

## 2019-08-24 DIAGNOSIS — R922 Inconclusive mammogram: Secondary | ICD-10-CM | POA: Diagnosis not present

## 2019-09-13 ENCOUNTER — Ambulatory Visit: Payer: Self-pay | Admitting: Emergency Medicine

## 2019-09-13 ENCOUNTER — Other Ambulatory Visit: Payer: Self-pay

## 2019-09-13 VITALS — BP 114/75 | HR 72 | Temp 98.0°F | Resp 16 | Ht 66.0 in | Wt 127.0 lb

## 2019-09-13 DIAGNOSIS — S40861A Insect bite (nonvenomous) of right upper arm, initial encounter: Secondary | ICD-10-CM

## 2019-09-13 MED ORDER — PREDNISONE 50 MG PO TABS: ORAL_TABLET | ORAL | 0 refills | Status: DC

## 2019-09-13 NOTE — Progress Notes (Signed)
Pt stated she was running through the woods when she felt something bite/stung her. Pt present with bite mark and red rash red sounding the area and warm to the touch. Pt stated it started off like a blood blister then at 12am it became painful and the redness is taking up the bottom half of right deltoid near elbow. CL,RMA

## 2019-09-13 NOTE — Progress Notes (Signed)
°  Occupational Health Provider Note       Time seen: 2:53 PM    I have reviewed the vital signs and the nursing notes.  HISTORY   Chief Complaint Insect Bite   HPI Margaret Rosario is a 44 y.o. female with no significant past medical history who presents today for possible insect bite or allergic reaction to the right elbow region. Pt stated she was running through the woods when she felt something bite/stung her. Pt stated it started off like a blood blister then at 12am it became painful and the redness is taking up the bottom half of right deltoid near elbow.  She denies any other complaints  Past Medical History:  Diagnosis Date   AMA (advanced maternal age) multigravida 35+     Past Surgical History:  Procedure Laterality Date   FEMORAL HERNIA REPAIR     KNEE ARTHROSCOPY Left    ORIF FINGER / THUMB FRACTURE      Allergies Patient has no known allergies.   Review of Systems Constitutional: Negative for fever. Musculoskeletal: Positive for right arm pain Skin: Positive for right elbow redness and swelling Neurological: Negative for headaches, focal weakness or numbness.  All systems negative/normal/unremarkable except as stated in the HPI  ____________________________________________   PHYSICAL EXAM:  VITAL SIGNS: Vitals:   09/13/19 1442  BP: 114/75  Pulse: 72  Resp: 16  Temp: 98 F (36.7 C)  SpO2: 99%    Constitutional: Alert and oriented. Well appearing and in no distress. Eyes: Conjunctivae are normal. Normal extraocular movements. ENT      Head: Normocephalic and atraumatic.      Nose: No congestion/rhinnorhea.      Mouth/Throat: Mucous membranes are moist.      Neck: No stridor. Cardiovascular: Normal rate, regular rhythm. No murmurs, rubs, or gallops. Respiratory: Normal respiratory effort without tachypnea nor retractions. Breath sounds are clear and equal bilaterally. No wheezes/rales/rhonchi. Musculoskeletal: Nontender with normal  range of motion in extremities.  Right medial elbow tenderness and swelling with erythema Neurologic:  Normal speech gait and language. No gross focal neurologic deficits are appreciated.  Skin: Redness and swelling in and around the right elbow medially, area is warm to touch Psychiatric: Speech and behavior are normal.    ASSESSMENT AND PLAN  Insect bite, allergic reaction   Plan: The patient had presented for recent insect envenomation and around the right elbow with redness and swelling.  I do not think this infection at this point, I think this is an inflammatory reaction which will improve with antihistamines and steroids.  She will be prescribed prednisone, advised to take Benadryl.  She is cleared for follow-up as needed.  Daryel November MD    Note: This note was generated in part or whole with voice recognition software. Voice recognition is usually quite accurate but there are transcription errors that can and very often do occur. I apologize for any typographical errors that were not detected and corrected.

## 2020-05-26 DIAGNOSIS — D2361 Other benign neoplasm of skin of right upper limb, including shoulder: Secondary | ICD-10-CM | POA: Diagnosis not present

## 2020-05-26 DIAGNOSIS — L821 Other seborrheic keratosis: Secondary | ICD-10-CM | POA: Diagnosis not present

## 2020-05-26 DIAGNOSIS — Z872 Personal history of diseases of the skin and subcutaneous tissue: Secondary | ICD-10-CM | POA: Diagnosis not present

## 2020-06-15 NOTE — Progress Notes (Signed)
Scheduled to complete physical with Blima Ledger, NP. Gretel Acre

## 2020-06-16 ENCOUNTER — Ambulatory Visit: Payer: Self-pay

## 2020-06-16 ENCOUNTER — Other Ambulatory Visit: Payer: Self-pay

## 2020-06-16 DIAGNOSIS — Z0000 Encounter for general adult medical examination without abnormal findings: Secondary | ICD-10-CM

## 2020-06-16 LAB — POCT URINALYSIS DIPSTICK
Bilirubin, UA: NEGATIVE
Blood, UA: NEGATIVE
Glucose, UA: NEGATIVE
Ketones, UA: NEGATIVE
Leukocytes, UA: NEGATIVE
Nitrite, UA: NEGATIVE
Protein, UA: NEGATIVE
Spec Grav, UA: 1.02 (ref 1.010–1.025)
Urobilinogen, UA: 0.2 E.U./dL
pH, UA: 6.5 (ref 5.0–8.0)

## 2020-06-17 LAB — CMP12+LP+TP+TSH+6AC+CBC/D/PLT
ALT: 24 IU/L (ref 0–32)
AST: 21 IU/L (ref 0–40)
Albumin/Globulin Ratio: 1.7 (ref 1.2–2.2)
Albumin: 4.5 g/dL (ref 3.8–4.8)
Alkaline Phosphatase: 35 IU/L — ABNORMAL LOW (ref 44–121)
BUN/Creatinine Ratio: 17 (ref 9–23)
BUN: 15 mg/dL (ref 6–24)
Basophils Absolute: 0.1 10*3/uL (ref 0.0–0.2)
Basos: 1 %
Bilirubin Total: 0.6 mg/dL (ref 0.0–1.2)
Calcium: 9.3 mg/dL (ref 8.7–10.2)
Chloride: 103 mmol/L (ref 96–106)
Chol/HDL Ratio: 3 ratio (ref 0.0–4.4)
Cholesterol, Total: 182 mg/dL (ref 100–199)
Creatinine, Ser: 0.88 mg/dL (ref 0.57–1.00)
EOS (ABSOLUTE): 0.1 10*3/uL (ref 0.0–0.4)
Eos: 2 %
Estimated CHD Risk: <0.5 times avg. (ref 0.0–1.0)
Free Thyroxine Index: 1.6 (ref 1.2–4.9)
GGT: 19 IU/L (ref 0–60)
Globulin, Total: 2.7 g/dL (ref 1.5–4.5)
Glucose: 99 mg/dL (ref 65–99)
HDL: 60 mg/dL (ref >39)
Hematocrit: 44.9 % (ref 34.0–46.6)
Hemoglobin: 14.6 g/dL (ref 11.1–15.9)
Immature Grans (Abs): 0 10*3/uL (ref 0.0–0.1)
Immature Granulocytes: 0 %
Iron: 87 ug/dL (ref 27–159)
LDH: 149 IU/L (ref 119–226)
LDL Chol Calc (NIH): 111 mg/dL — ABNORMAL HIGH (ref 0–99)
Lymphocytes Absolute: 1.6 10*3/uL (ref 0.7–3.1)
Lymphs: 35 %
MCH: 29.9 pg (ref 26.6–33.0)
MCHC: 32.5 g/dL (ref 31.5–35.7)
MCV: 92 fL (ref 79–97)
Monocytes Absolute: 0.5 10*3/uL (ref 0.1–0.9)
Monocytes: 10 %
Neutrophils Absolute: 2.4 10*3/uL (ref 1.4–7.0)
Neutrophils: 52 %
Phosphorus: 2.9 mg/dL — ABNORMAL LOW (ref 3.0–4.3)
Platelets: 267 10*3/uL (ref 150–450)
Potassium: 4.3 mmol/L (ref 3.5–5.2)
RBC: 4.89 x10E6/uL (ref 3.77–5.28)
RDW: 11.8 % (ref 11.7–15.4)
Sodium: 141 mmol/L (ref 134–144)
T3 Uptake Ratio: 27 % (ref 24–39)
T4, Total: 5.9 ug/dL (ref 4.5–12.0)
TSH: 2.19 u[IU]/mL (ref 0.450–4.500)
Total Protein: 7.2 g/dL (ref 6.0–8.5)
Triglycerides: 57 mg/dL (ref 0–149)
Uric Acid: 6.3 mg/dL — ABNORMAL HIGH (ref 2.6–6.2)
VLDL Cholesterol Cal: 11 mg/dL (ref 5–40)
WBC: 4.6 10*3/uL (ref 3.4–10.8)
eGFR: 84 mL/min/{1.73_m2} (ref >59)

## 2020-07-05 ENCOUNTER — Ambulatory Visit: Payer: Self-pay | Admitting: Adult Health

## 2020-07-05 ENCOUNTER — Other Ambulatory Visit: Payer: Self-pay

## 2020-07-05 ENCOUNTER — Encounter: Payer: Self-pay | Admitting: Adult Health

## 2020-07-05 VITALS — BP 116/83 | HR 72 | Temp 97.9°F | Resp 12 | Ht 67.0 in | Wt 127.0 lb

## 2020-07-05 DIAGNOSIS — R002 Palpitations: Secondary | ICD-10-CM

## 2020-07-05 DIAGNOSIS — Z0000 Encounter for general adult medical examination without abnormal findings: Secondary | ICD-10-CM

## 2020-07-05 DIAGNOSIS — M21619 Bunion of unspecified foot: Secondary | ICD-10-CM

## 2020-07-05 NOTE — Progress Notes (Signed)
[facility] [address] [place], [place] [address]  Internal MEDICINE  Office Visit Note  Patient Name: Margaret Rosario  [id number]  [id number]  Date of Service: 07/05/2020  Chief Complaint  Patient presents with  . Annual Exam    Pt denies any issues or concerns bedsides right big toe injury 20 years ago. Each year its starting to get worse. Pt now has knot on the side of big toe. Similar to looking like a bunion. CLRMA     HPI Pt is here for routine health maintenance examination.  She is a well appearing 45 yo woman.  She is married to another [place] here in [place].She has worked for [employer] for 14 years.  They have a 35 years old daughter. She denies any significant medical/surgical history.  She exercises regullarly, including running, lifting, interval training and yoga.  She is complaining of a worsening Right big toe deformity, that she is concerned is a bunion.  Her labs and EKG are reviewed.  EKG shows some low voltage and absense of P-waves.  Patient denies any chest pain or SOB, but does report some Palpitations and racing heart recently, which she attributed to cold medication.   Current Medication: Outpatient Encounter Medications as of 07/05/2020  Medication Sig  . acyclovir (ZOVIRAX) 400 MG tablet Take 1 tablet (400 mg total) by mouth 5 (five) times daily.  . [DISCONTINUED] predniSONE (DELTASONE) 50 MG tablet Take one tablet by mouth daily   No facility-administered encounter medications on file as of 07/05/2020.    Surgical History: Past Surgical History:  Procedure Laterality Date  . FEMORAL HERNIA REPAIR    . KNEE ARTHROSCOPY Left   . ORIF FINGER / THUMB FRACTURE      Medical History: Past Medical History:  Diagnosis Date  . AMA (advanced maternal age) multigravida 68+     Family History: Family History  Problem Relation Age of Onset  . Graves' disease Brother   . Cancer Maternal Grandfather        lung    Social History: Social History    Socioeconomic History  . Marital status: Married    Spouse name: Not on file  . Number of children: Not on file  . Years of education: Not on file  . Highest education level: Not on file  Occupational History  . Not on file  Tobacco Use  . Smoking status: Never Smoker  . Smokeless tobacco: Never Used  Substance and Sexual Activity  . Alcohol use: Yes    Alcohol/week: 3.0 standard drinks    Types: 3 Cans of beer per week    Comment: socially  . Drug use: No  . Sexual activity: Yes    Partners: Male    Birth control/protection: None  Other Topics Concern  . Not on file  Social History Narrative  . Not on file   Social Determinants of Health   Financial Resource Strain: Not on file  Food Insecurity: Not on file  Transportation Needs: Not on file  Physical Activity: Not on file  Stress: Not on file  Social Connections: Not on file      Review of Systems  Constitutional: Negative for activity change, appetite change and fever.  HENT: Negative for congestion, postnasal drip, sinus pain and sore throat.   Eyes: Negative for pain, discharge and itching.  Respiratory: Negative for cough and shortness of breath.   Cardiovascular: Positive for palpitations. Negative for chest pain and leg swelling.  Gastrointestinal: Negative  for abdominal distention and abdominal pain.  Endocrine: Negative for cold intolerance and heat intolerance.  Genitourinary: Negative for difficulty urinating and flank pain.  Musculoskeletal: Negative for arthralgias and joint swelling.  Skin: Negative for color change and rash.  Neurological: Negative for dizziness and headaches.  Hematological: Negative for adenopathy.  Psychiatric/Behavioral: Negative for agitation and confusion.     Vital Signs: BP 116/83   Pulse 72   Temp 97.9 F (36.6 C)   Resp 12   Ht '5\' 7"'  (1.702 m)   Wt 127 lb (57.6 kg)   SpO2 99%   BMI 19.89 kg/m    Physical Exam Vitals and nursing note reviewed.   Constitutional:      General: She is not in acute distress.    Appearance: She is normal weight. She is not ill-appearing.  HENT:     Head: Normocephalic.     Right Ear: Tympanic membrane normal.     Left Ear: Tympanic membrane normal.     Nose: Nose normal.     Mouth/Throat:     Mouth: Mucous membranes are dry.  Eyes:     General:        Right eye: No discharge.        Left eye: No discharge.     Pupils: Pupils are equal, round, and reactive to light.  Neck:     Vascular: No carotid bruit.  Cardiovascular:     Rate and Rhythm: Normal rate and regular rhythm.     Pulses: Normal pulses.     Heart sounds: No murmur heard. No friction rub.  Pulmonary:     Effort: Pulmonary effort is normal. No respiratory distress.     Breath sounds: Normal breath sounds. No wheezing.  Abdominal:     General: Abdomen is flat. Bowel sounds are normal.     Palpations: Abdomen is soft. There is no mass.     Tenderness: There is no abdominal tenderness. There is no right CVA tenderness or left CVA tenderness.     Hernia: No hernia is present.  Musculoskeletal:        General: Normal range of motion.     Cervical back: Normal range of motion. No rigidity.  Lymphadenopathy:     Cervical: No cervical adenopathy.  Skin:    General: Skin is warm and dry.  Neurological:     General: No focal deficit present.     Mental Status: She is alert and oriented to person, place, and time.  Psychiatric:        Mood and Affect: Mood normal.        Behavior: Behavior normal.      LABS: Recent Results (from the past 2160 hour(s))  CMP12+LP+TP+TSH+6AC+CBC/D/Plt     Status: Abnormal   Collection Time: 06/16/20  8:44 AM  Result Value Ref Range   Glucose 99 65 - 99 mg/dL   Uric Acid 6.3 (H) 2.6 - 6.2 mg/dL    Comment:            Therapeutic target for gout patients: <6.0   BUN 15 6 - 24 mg/dL   Creatinine, Ser 0.88 0.57 - 1.00 mg/dL   eGFR 84 >59 mL/min/1.73   BUN/Creatinine Ratio 17 9 - 23   Sodium  141 134 - 144 mmol/L   Potassium 4.3 3.5 - 5.2 mmol/L   Chloride 103 96 - 106 mmol/L   Calcium 9.3 8.7 - 10.2 mg/dL   Phosphorus 2.9 (L) 3.0 - 4.3 mg/dL  Total Protein 7.2 6.0 - 8.5 g/dL   Albumin 4.5 3.8 - 4.8 g/dL   Globulin, Total 2.7 1.5 - 4.5 g/dL   Albumin/Globulin Ratio 1.7 1.2 - 2.2   Bilirubin Total 0.6 0.0 - 1.2 mg/dL   Alkaline Phosphatase 35 (L) 44 - 121 IU/L   LDH 149 119 - 226 IU/L   AST 21 0 - 40 IU/L   ALT 24 0 - 32 IU/L   GGT 19 0 - 60 IU/L   Iron 87 27 - 159 ug/dL   Cholesterol, Total 182 100 - 199 mg/dL   Triglycerides 57 0 - 149 mg/dL   HDL 60 >39 mg/dL   VLDL Cholesterol Cal 11 5 - 40 mg/dL   LDL Chol Calc (NIH) 111 (H) 0 - 99 mg/dL   Chol/HDL Ratio 3.0 0.0 - 4.4 ratio    Comment:                                   T. Chol/HDL Ratio                                             Men  Women                               1/2 Avg.Risk  3.4    3.3                                   Avg.Risk  5.0    4.4                                2X Avg.Risk  9.6    7.1                                3X Avg.Risk 23.4   11.0    Estimated CHD Risk  < 0.5 0.0 - 1.0 times avg.    Comment: The CHD Risk is based on the T. Chol/HDL ratio. Other factors affect CHD Risk such as hypertension, smoking, diabetes, severe obesity, and family history of premature CHD.    TSH 2.190 0.450 - 4.500 uIU/mL   T4, Total 5.9 4.5 - 12.0 ug/dL   T3 Uptake Ratio 27 24 - 39 %   Free Thyroxine Index 1.6 1.2 - 4.9   WBC 4.6 3.4 - 10.8 x10E3/uL   RBC 4.89 3.77 - 5.28 x10E6/uL   Hemoglobin 14.6 11.1 - 15.9 g/dL   Hematocrit 44.9 34.0 - 46.6 %   MCV 92 79 - 97 fL   MCH 29.9 26.6 - 33.0 pg   MCHC 32.5 31.5 - 35.7 g/dL   RDW 11.8 11.7 - 15.4 %   Platelets 267 150 - 450 x10E3/uL   Neutrophils 52 Not Estab. %   Lymphs 35 Not Estab. %   Monocytes 10 Not Estab. %   Eos 2 Not Estab. %   Basos 1 Not Estab. %   Neutrophils Absolute 2.4 1.4 - 7.0 x10E3/uL   Lymphocytes Absolute 1.6 0.7 - 3.1 x10E3/uL    Monocytes  Absolute 0.5 0.1 - 0.9 x10E3/uL   EOS (ABSOLUTE) 0.1 0.0 - 0.4 x10E3/uL   Basophils Absolute 0.1 0.0 - 0.2 x10E3/uL   Immature Granulocytes 0 Not Estab. %   Immature Grans (Abs) 0.0 0.0 - 0.1 x10E3/uL  POCT urinalysis dipstick     Status: None   Collection Time: 06/16/20  9:28 AM  Result Value Ref Range   Color, UA Light Yellow    Clarity, UA Clear    Glucose, UA Negative Negative   Bilirubin, UA Negative    Ketones, UA Negative    Spec Grav, UA 1.020 1.010 - 1.025   Blood, UA Negative    pH, UA 6.5 5.0 - 8.0   Protein, UA Negative Negative   Urobilinogen, UA 0.2 0.2 or 1.0 E.U./dL   Nitrite, UA Negative    Leukocytes, UA Negative Negative   Appearance     Odor       Assessment/Plan: 1. Annual physical exam Reviewed PHM.Marland Kitchen  Discussed importance of PAP smears regullarly, encouraged her to make an appt with her GYN.    2. Palpitations EKG showed low voltage, and absence of P-Waves.  Likely non-issue, however due to patients recent palpitations, I gave her the choice to be evaluated by cardiology and she would like to see them. - Ambulatory referral to Cardiology  3. Bunion of great toe See Podiatry for eval and treatment.  - Ambulatory referral to Podiatry    General Counseling: Essie Christine understanding of the findings of todays visit and agrees with plan of treatment. I have discussed any further diagnostic evaluation that may be needed or ordered today. We also reviewed her medications today. she has been encouraged to call the office with any questions or concerns that should arise related to todays visit.    Orders Placed This Encounter  Procedures  . Ambulatory referral to Cardiology  . Ambulatory referral to Podiatry    No orders of the defined types were placed in this encounter.   Total time spent: 30 Minutes  Time spent includes review of chart, medications, test results, and follow up plan with the patient.    Kendell Bane  AGNP-C Nurse Practitioner

## 2020-08-01 ENCOUNTER — Telehealth: Payer: Self-pay

## 2020-08-01 DIAGNOSIS — N6489 Other specified disorders of breast: Secondary | ICD-10-CM

## 2020-08-01 NOTE — Telephone Encounter (Signed)
Margaret Rosario called requesting orders for Annual Mammogram.  States she called [name] at [facility] to schedule a mammogram & was informed she needed orders for follow-up of 08/24/19 mammogram.  Orders for Bilateral Diagnostic Mammogram & US Breast Ltd Uni Rt put in Epic.  AMD

## 2020-08-04 ENCOUNTER — Other Ambulatory Visit: Payer: 59

## 2020-08-18 ENCOUNTER — Encounter: Payer: Self-pay | Admitting: Cardiology

## 2020-08-18 ENCOUNTER — Other Ambulatory Visit: Payer: Self-pay

## 2020-08-18 ENCOUNTER — Ambulatory Visit (INDEPENDENT_AMBULATORY_CARE_PROVIDER_SITE_OTHER): Payer: 59 | Admitting: Cardiology

## 2020-08-18 VITALS — BP 110/64 | HR 62 | Ht 67.0 in | Wt 130.0 lb

## 2020-08-18 DIAGNOSIS — R9431 Abnormal electrocardiogram [ECG] [EKG]: Secondary | ICD-10-CM

## 2020-08-18 NOTE — Patient Instructions (Signed)
Medication Instructions:  Your physician recommends that you continue on your current medications as directed. Please refer to the Current Medication list given to you today.  *If you need a refill on your cardiac medications before your next appointment, please call your pharmacy*   Lab Work: None ordered If you have labs (blood work) drawn today and your tests are completely normal, you will receive your results only by: MyChart Message (if you have MyChart) OR A paper copy in the mail If you have any lab test that is abnormal or we need to change your treatment, we will call you to review the results.   Testing/Procedures:  None ordered   Follow-Up: At [facility], you and your health needs are our priority.  As part of our continuing mission to provide you with exceptional heart care, we have created designated Provider Care Teams.  These Care Teams include your primary Cardiologist (physician) and Advanced Practice Providers (APPs -  Physician Assistants and Nurse Practitioners) who all work together to provide you with the care you need, when you need it.  We recommend signing up for the patient portal called "MyChart".  Sign up information is provided on this After Visit Summary.  MyChart is used to connect with patients for Virtual Visits (Telemedicine).  Patients are able to view lab/test results, encounter notes, upcoming appointments, etc.  Non-urgent messages can be sent to your provider as well.   To learn more about what you can do with MyChart, go to [email or web address].com.    Your next appointment:   Follow up as needed   The format for your next appointment:   In Person  Provider:   You may see Brian Agbor-Etang, MD or one of the following Advanced Practice Providers on your designated Care Team:   Christopher Berge, NP Ryan Dunn, PA-C Jacquelyn Visser, PA-C Cadence Furth, PA-C   Other Instructions   

## 2020-08-18 NOTE — Progress Notes (Signed)
Cardiology Office Note:    Date:  08/18/2020   ID:  Margaret Rosario, DOB 10-Jul-1975, MRN [id number]  PCP:  Margaret Rosario   [facility] Providers Cardiologist:  Margaret Odea, MD     Referring MD: Margaret Acosta, NP   Chief Complaint  Patient presents with   Other    Referred by PCP for Palpitations. Meds reviewed verbally with patient.    Margaret Rosario is a 45 y.o. female who is being seen today for the evaluation of abnormal EKG at the request of Margaret Acosta, NP.   History of Present Illness:    Margaret Rosario is a 45 y.o. female with no significant past medical history who presents due to abnormal EKG.  Patient saw her primary care provider where the EKG was obtained on 06/16/2020 which was noted to be abnormal.  P wave morphology was abnormal.  She was referred to see cardiology.  Denies chest pain, shortness of breath, palpitations, dizziness, edema.  She feels well, has no concerns at this time.  Past Medical History:  Diagnosis Date   AMA (advanced maternal age) multigravida 35+     Past Surgical History:  Procedure Laterality Date   FEMORAL HERNIA REPAIR     KNEE ARTHROSCOPY Left    ORIF FINGER / THUMB FRACTURE      Current Medications: Current Meds  Medication Sig   acyclovir (ZOVIRAX) 400 MG tablet Take 1 tablet (400 mg total) by mouth 5 (five) times daily.     Allergies:   Patient has no known allergies.   Social History   Socioeconomic History   Marital status: Married    Spouse name: Not on file   Number of children: Not on file   Years of education: Not on file   Highest education level: Not on file  Occupational History   Not on file  Tobacco Use   Smoking status: Never   Smokeless tobacco: Never  Substance and Sexual Activity   Alcohol use: Yes    Alcohol/week: 3.0 standard drinks    Types: 3 Cans of beer per week    Comment: socially   Drug use: No   Sexual activity: Yes    Partners: Male    Birth control/protection:  None  Other Topics Concern   Not on file  Social History Narrative   Not on file   Social Determinants of Health   Financial Resource Strain: Not on file  Food Insecurity: Not on file  Transportation Needs: Not on file  Physical Activity: Not on file  Stress: Not on file  Social Connections: Not on file     Family History: The patient's family history includes Cancer in her maternal grandfather; Margaret Rosario' disease in her brother.  ROS:   Please see the history of present illness.     All other systems reviewed and are negative.  EKGs/Labs/Other Studies Reviewed:    The following studies were reviewed today:   EKG:  EKG is  ordered today.  The ekg ordered today demonstrates normal sinus rhythm, normal ECG.  Recent Labs: 06/16/2020: ALT 24; BUN 15; Creatinine, Ser 0.88; Hemoglobin 14.6; Platelets 267; Potassium 4.3; Sodium 141; TSH 2.190  Recent Lipid Panel    Component Value Date/Time   CHOL 182 06/16/2020 0844   TRIG 57 06/16/2020 0844   HDL 60 06/16/2020 0844   CHOLHDL 3.0 06/16/2020 0844   LDLCALC 111 (H) 06/16/2020 0844     Risk Assessment/Calculations:  Physical Exam:    VS:  BP 110/64 (BP Location: Left Arm, Patient Position: Sitting, Cuff Size: Normal)   Pulse 62   Ht 5\' 7"  (1.702 m)   Wt 130 lb (59 kg)   SpO2 98%   BMI 20.36 kg/m     Wt Readings from Last 3 Encounters:  08/18/20 130 lb (59 kg)  07/05/20 127 lb (57.6 kg)  09/13/19 127 lb (57.6 kg)     GEN:  Well nourished, well developed in no acute distress HEENT: Normal NECK: No JVD; No carotid bruits LYMPHATICS: No lymphadenopathy CARDIAC: RRR, no murmurs, rubs, gallops RESPIRATORY:  Clear to auscultation without rales, wheezing or rhonchi  ABDOMEN: Soft, non-tender, non-distended MUSCULOSKELETAL:  No edema; No deformity  SKIN: Warm and dry NEUROLOGIC:  Alert and oriented x 3 PSYCHIATRIC:  Normal affect   ASSESSMENT:    1. EKG abnormality    PLAN:    In order of problems  listed above:  Nonspecific P wave abnormality noted on EKG on 06/16/2020.  EKG reviewed by myself with no abnormalities, appears normal.  EKG today in the office also is normal.  Patient made aware of results, reassured.  She denies any history of heart disease, has no cardiac risk factors.  Denies palpitations, edema.  Follow-up as needed   Medication Adjustments/Labs and Tests Ordered: Current medicines are reviewed at length with the patient today.  Concerns regarding medicines are outlined above.  Orders Placed This Encounter  Procedures   EKG 12-Lead    No orders of the defined types were placed in this encounter.   Patient Instructions  Medication Instructions:  Your physician recommends that you continue on your current medications as directed. Please refer to the Current Medication list given to you today.   *If you need a refill on your cardiac medications before your next appointment, please call your pharmacy*   Lab Work: None ordered If you have labs (blood work) drawn today and your tests are completely normal, you will receive your results only by: MyChart Message (if you have MyChart) OR A paper copy in the mail If you have any lab test that is abnormal or we need to change your treatment, we will call you to review the results.   Testing/Procedures: None ordered   Follow-Up: At [facility], you and your health needs are our priority.  As part of our continuing mission to provide you with exceptional heart care, we have created designated Provider Care Teams.  These Care Teams include your primary Cardiologist (physician) and Advanced Practice Providers (APPs -  Physician Assistants and Nurse Practitioners) who all work together to provide you with the care you need, when you need it.  We recommend signing up for the patient portal called "MyChart".  Sign up information is provided on this After Visit Summary.  MyChart is used to connect with patients for  Virtual Visits (Telemedicine).  Patients are able to view lab/test results, encounter notes, upcoming appointments, etc.  Non-urgent messages can be sent to your provider as well.   To learn more about what you can do with MyChart, go to [facility].    Your next appointment:   Follow up as needed   The format for your next appointment:   In Person  Provider:   You may see [email or web address], MD or one of the following Advanced Practice Providers on your designated Care Team:   Margaret Odea, NP Nicolasa Ducking, PA-C Eula Listen, PA-C Cadence Marisue Ivan, Fransico Michael   Other  Instructions    Signed, Margaret Odea, MD  08/18/2020 12:22 PM    [facility]

## 2020-08-24 ENCOUNTER — Ambulatory Visit
Admission: RE | Admit: 2020-08-24 | Discharge: 2020-08-24 | Disposition: A | Discharge status: Home / Self Care | Payer: 59 | Source: Ambulatory Visit | Attending: Physician Assistant | Admitting: Physician Assistant

## 2020-08-24 ENCOUNTER — Other Ambulatory Visit: Payer: Self-pay

## 2020-08-24 DIAGNOSIS — N6489 Other specified disorders of breast: Secondary | ICD-10-CM | POA: Insufficient documentation

## 2020-08-24 DIAGNOSIS — R922 Inconclusive mammogram: Secondary | ICD-10-CM | POA: Diagnosis not present

## 2020-08-25 ENCOUNTER — Other Ambulatory Visit: Payer: Self-pay | Admitting: Physician Assistant

## 2020-12-26 ENCOUNTER — Ambulatory Visit: Payer: Self-pay | Admitting: Physician Assistant

## 2020-12-26 ENCOUNTER — Encounter: Payer: Self-pay | Admitting: Physician Assistant

## 2020-12-26 ENCOUNTER — Other Ambulatory Visit: Payer: Self-pay

## 2020-12-26 VITALS — BP 112/83 | HR 60 | Temp 97.7°F | Resp 14 | Ht 66.0 in | Wt 125.0 lb

## 2020-12-26 DIAGNOSIS — J069 Acute upper respiratory infection, unspecified: Secondary | ICD-10-CM

## 2020-12-26 DIAGNOSIS — J040 Acute laryngitis: Secondary | ICD-10-CM

## 2020-12-26 MED ORDER — METHYLPREDNISOLONE 4 MG PO TBPK: ORAL_TABLET | ORAL | 0 refills | Status: DC

## 2020-12-26 MED ORDER — LIDOCAINE VISCOUS HCL 2 % MT SOLN: 5.0000 mL | Freq: Four times a day (QID) | OROMUCOSAL | 0 refills | Status: DC | PRN

## 2020-12-26 MED ORDER — PSEUDOEPH-BROMPHEN-DM 30-2-10 MG/5ML PO SYRP: 5.0000 mL | ORAL_SOLUTION | Freq: Four times a day (QID) | ORAL | 0 refills | Status: DC | PRN

## 2020-12-26 MED ORDER — AZITHROMYCIN 250 MG PO TABS: ORAL_TABLET | ORAL | 0 refills | Status: AC

## 2020-12-26 NOTE — Progress Notes (Signed)
Throat sore,congestion, cough and fatigue. Pt feels like this is the third time this month getting sick with these symptoms. Pt as of now is horse. Pt states she was tested negative a month ago with at home test.

## 2020-12-26 NOTE — Progress Notes (Signed)
   Subjective: URI    Patient ID: Margaret Rosario, female    DOB: 1975-08-06, 45 y.o.   MRN: [id number]  HPI Patient presents with over 1 month of URI signs and symptoms consistent mostly of nasal congestion, sore throat, nonproductive cough, and recent decreased voice volume.  Patient denies recent travel or known contact with COVID.  Patient states complaint has waxed and waned over the past month.  Mild relief with over-the-counter medications.    Review of Systems Negative except for chief complaint    Objective:   Physical Exam No acute distress.  Decreased voice volume.  Temperature 97.7, pulse 60, respiration 14, BP is 112/83, patient 99% O2 sat on room air.  Patient weighs 125. HEENT remarkable for erythematous pharynx.  Nonproductive cough.  Bilateral upper lobe Rales.  Rhythm.       Assessment & Plan: Upper respiratory infection laryngitis   Patient given a prescription for Z-Pak, Bromfed-DM, viscous lidocaine, and Medrol Dosepak.  Patient advised to follow-up with 3 to 5 days if no improvement or worsening complaint

## 2021-03-03 IMAGING — MG MM DIGITAL DIAGNOSTIC UNILAT*R* W/ TOMO W/ CAD
6 series · 6 of 18 positions shown · non-contrast
Comparison: 08/03/2019

CLINICAL DATA: Patient presents after baseline screening study for
evaluation possible RIGHT breast asymmetry.

EXAM:
DIGITAL DIAGNOSTIC RIGHT MAMMOGRAM WITH CAD AND TOMO
ULTRASOUND RIGHT BREAST

[R ML synth-2D]
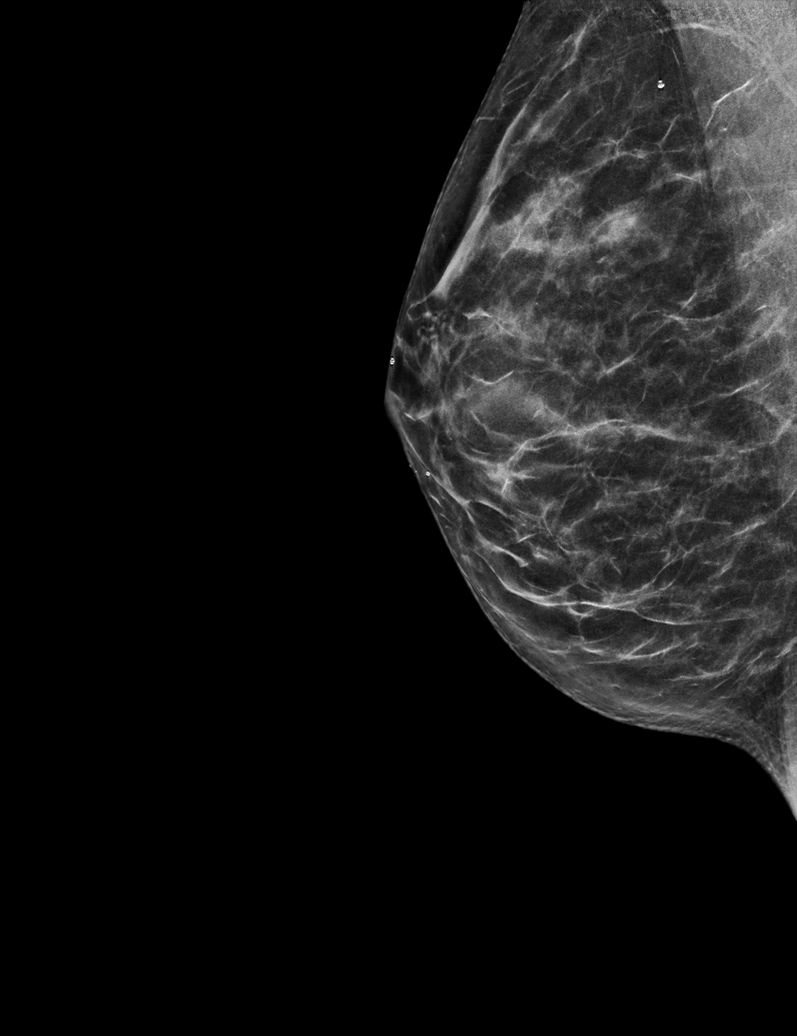

[R MLO synth-2D]
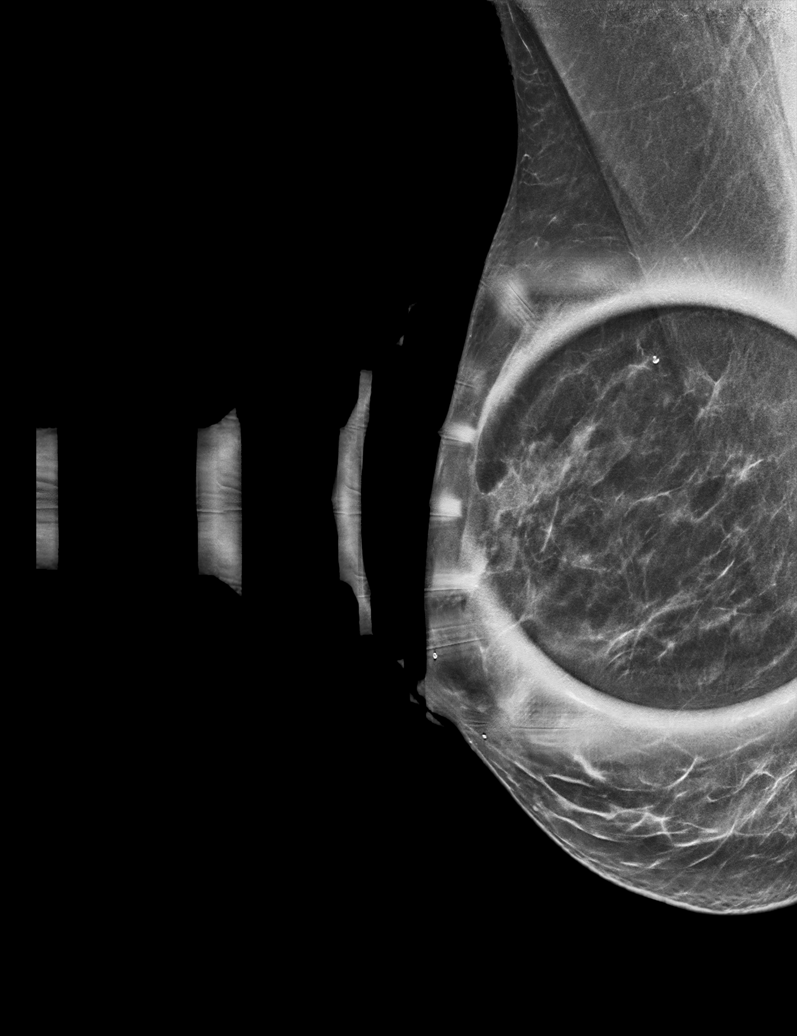

[R XCCL synth-2D]
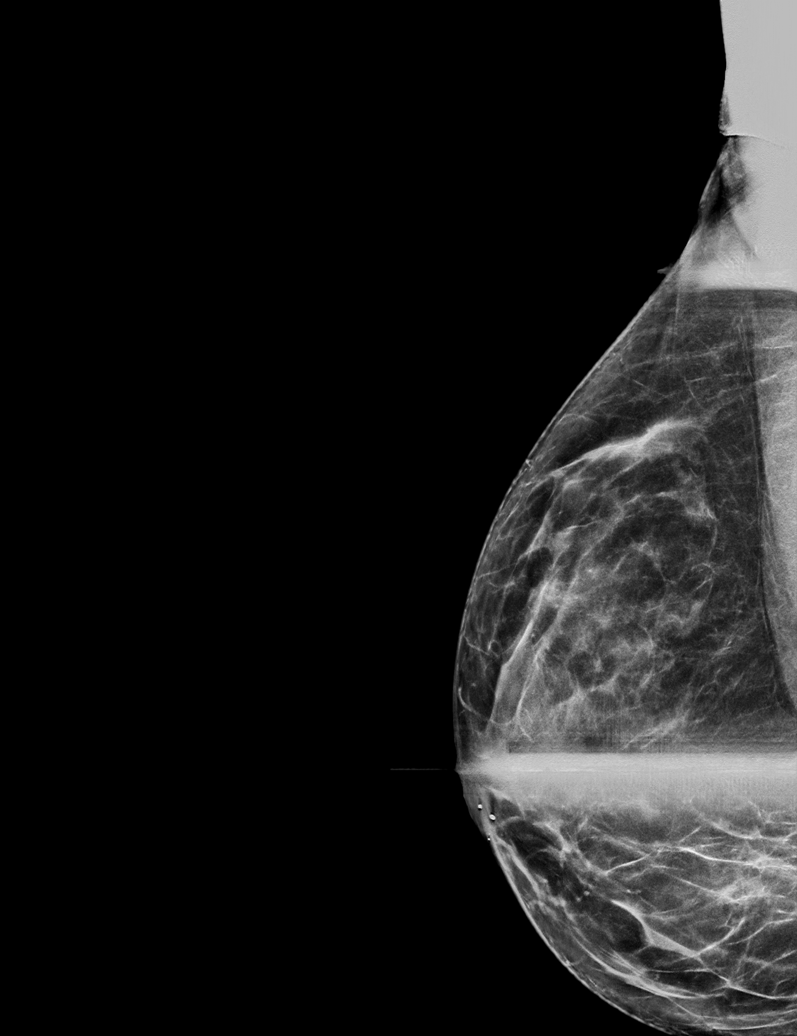

[R MLO tomo · tomo slice 29/57.0]
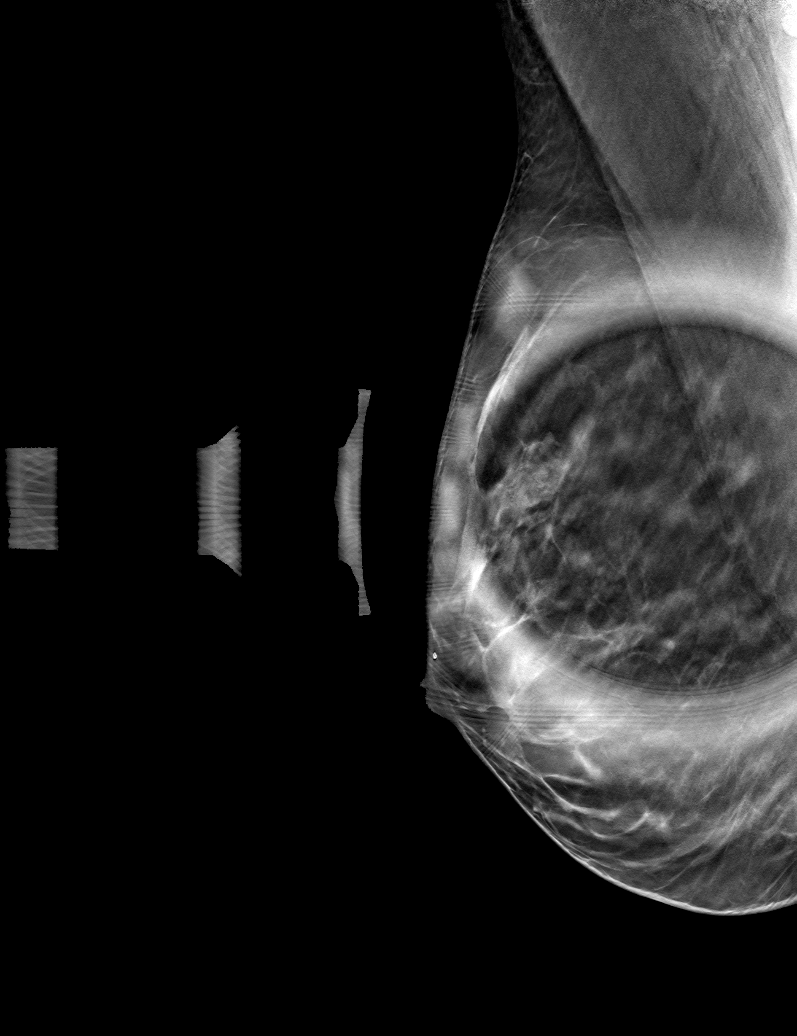

[R ML tomo · tomo slice 27/54.0]
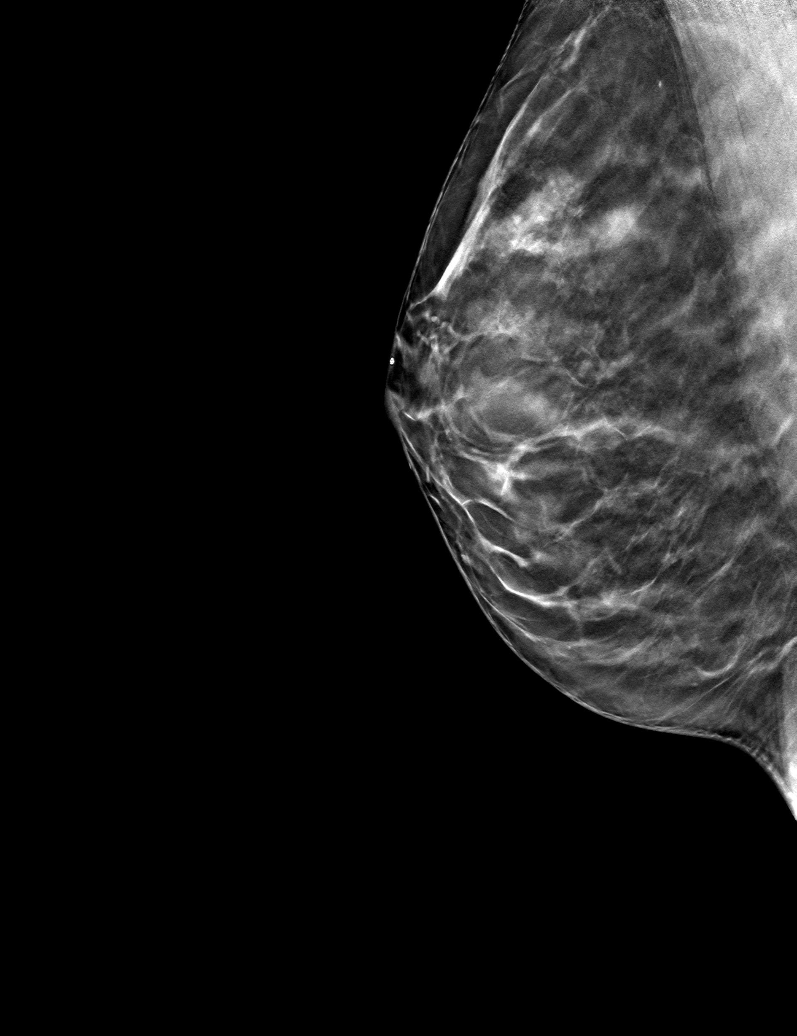

[R XCCL tomo · tomo slice 29/56.0]
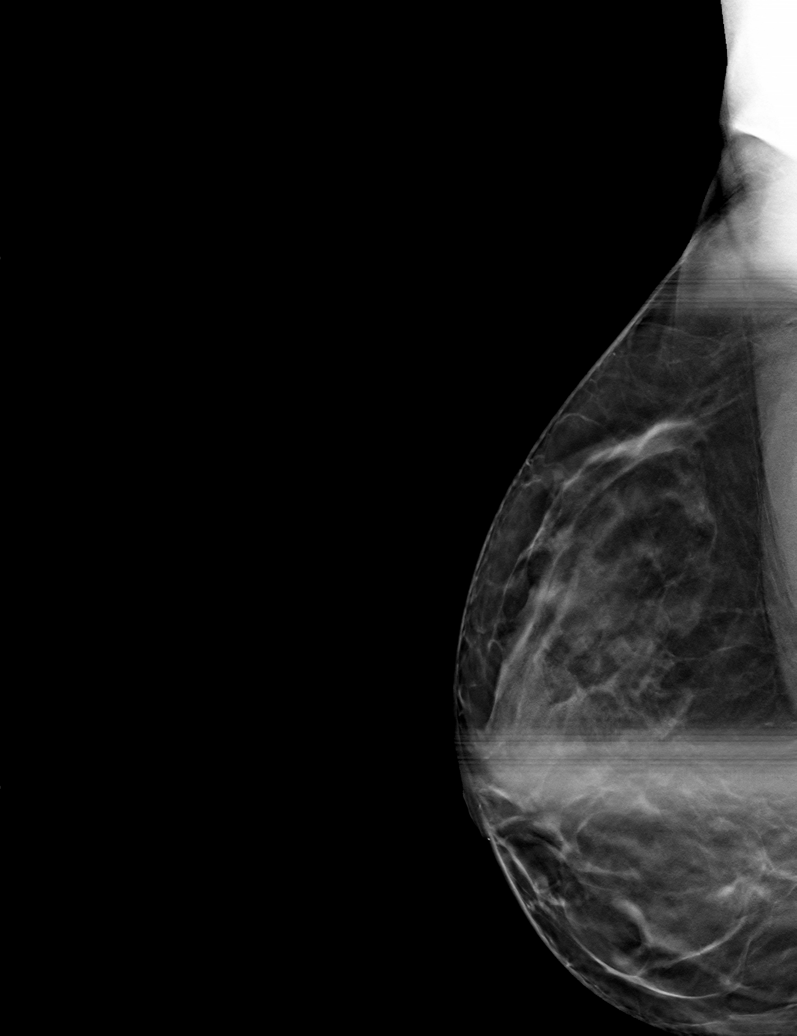

[6 of 18 positions shown; findings below may reference images not displayed]

ACR Breast Density Category c: The breast tissue is heterogeneously
dense, which may obscure small masses.
FINDINGS: Additional 2-D and 3-D images are performed. These views show no
discrete mass or distortion in the UPPER-OUTER QUADRANT of the RIGHT
breast. Asymmetry identified on screening study is likely benign
breast parenchyma.

Mammographic images were processed with CAD.

Targeted ultrasound is performed, showing normal appearing
fibroglandular tissue throughout the UPPER OUTER QUADRANT of the
RIGHT breast. No suspicious mass, distortion, or acoustic shadowing
is demonstrated with ultrasound.
IMPRESSION: Asymmetry in the superior portion of the RIGHT breast is likely
benign breast parenchyma. No mammographic or ultrasound evidence for
malignancy.

RECOMMENDATION:
Recommend RIGHT diagnostic mammogram in 6 months to assess stability
of parenchymal pattern.

I have discussed the findings and recommendations with the patient.
If applicable, a reminder letter will be sent to the patient
regarding the next appointment.

BI-RADS CATEGORY  3: Probably benign.

## 2021-05-07 DIAGNOSIS — L309 Dermatitis, unspecified: Secondary | ICD-10-CM | POA: Diagnosis not present

## 2021-05-07 DIAGNOSIS — D2261 Melanocytic nevi of right upper limb, including shoulder: Secondary | ICD-10-CM | POA: Diagnosis not present

## 2021-05-07 DIAGNOSIS — D2271 Melanocytic nevi of right lower limb, including hip: Secondary | ICD-10-CM | POA: Diagnosis not present

## 2021-05-07 DIAGNOSIS — D2262 Melanocytic nevi of left upper limb, including shoulder: Secondary | ICD-10-CM | POA: Diagnosis not present

## 2021-05-07 DIAGNOSIS — D225 Melanocytic nevi of trunk: Secondary | ICD-10-CM | POA: Diagnosis not present

## 2021-05-07 DIAGNOSIS — L821 Other seborrheic keratosis: Secondary | ICD-10-CM | POA: Diagnosis not present

## 2021-05-07 DIAGNOSIS — D2272 Melanocytic nevi of left lower limb, including hip: Secondary | ICD-10-CM | POA: Diagnosis not present

## 2021-07-25 ENCOUNTER — Ambulatory Visit: Payer: Self-pay

## 2021-07-25 DIAGNOSIS — Z0000 Encounter for general adult medical examination without abnormal findings: Secondary | ICD-10-CM

## 2021-07-25 LAB — POCT URINALYSIS DIPSTICK
Bilirubin, UA: NEGATIVE
Blood, UA: NEGATIVE
Glucose, UA: NEGATIVE
Ketones, UA: NEGATIVE
Leukocytes, UA: NEGATIVE
Nitrite, UA: NEGATIVE
Protein, UA: NEGATIVE
Spec Grav, UA: >=1.03 — AB (ref 1.010–1.025)
Urobilinogen, UA: 0.2 E.U./dL
pH, UA: 6 (ref 5.0–8.0)

## 2021-07-25 NOTE — Progress Notes (Signed)
Pt presents today for physical labs, will return to clinic for scheduled physical.  

## 2021-07-26 LAB — CMP12+LP+TP+TSH+6AC+CBC/D/PLT
Alkaline Phosphatase: 41 IU/L — ABNORMAL LOW (ref 44–121)
Basophils Absolute: 0 10*3/uL (ref 0.0–0.2)
Basos: 1 %
EOS (ABSOLUTE): 0.1 10*3/uL (ref 0.0–0.4)
Estimated CHD Risk: 0.5 times avg. (ref 0.0–1.0)
Free Thyroxine Index: 2 (ref 1.2–4.9)
GGT: 19 IU/L (ref 0–60)
Globulin, Total: 2.3 g/dL (ref 1.5–4.5)
Glucose: 88 mg/dL (ref 70–99)
Hematocrit: 42.6 % (ref 34.0–46.6)
Iron: 110 ug/dL (ref 27–159)
LDL Chol Calc (NIH): 129 mg/dL — ABNORMAL HIGH (ref 0–99)
MCH: 30.7 pg (ref 26.6–33.0)
MCV: 90 fL (ref 79–97)
Neutrophils Absolute: 1.9 10*3/uL (ref 1.4–7.0)
Platelets: 272 10*3/uL (ref 150–450)
Sodium: 141 mmol/L (ref 134–144)
TSH: 2.38 u[IU]/mL (ref 0.450–4.500)
Total Protein: 6.6 g/dL (ref 6.0–8.5)
Triglycerides: 82 mg/dL (ref 0–149)
VLDL Cholesterol Cal: 15 mg/dL (ref 5–40)

## 2021-08-01 ENCOUNTER — Encounter: Payer: Self-pay | Admitting: Physician Assistant

## 2021-08-01 ENCOUNTER — Ambulatory Visit: Payer: Self-pay | Admitting: Physician Assistant

## 2021-08-01 VITALS — BP 109/75 | HR 61 | Temp 97.6°F | Resp 12 | Ht 66.0 in | Wt 126.0 lb

## 2021-08-01 DIAGNOSIS — Z0000 Encounter for general adult medical examination without abnormal findings: Secondary | ICD-10-CM

## 2021-08-01 NOTE — Progress Notes (Signed)
Pt presents today to complete physical, Pt denies any issues or concerns at this time/CL,RMA 

## 2021-08-01 NOTE — Progress Notes (Signed)
[facility] ____________________________________________   None    (approximate)  I have reviewed the triage vital signs and the nursing notes.   HISTORY  Chief Complaint Annual Exam   HPI Margaret Rosario is a 46 y.o. female patient presents for annual physical exam.  Patient was no concerns or complaints.         Past Medical History:  Diagnosis Date   AMA (advanced maternal age) multigravida 35+     Patient Active Problem List   Diagnosis Date Noted   Pregnancy 04/19/2013   Fetal microcephaly affecting antepartum care of mother 03/01/2013   Advanced maternal age, primigravida, antepartum 08/20/2012    Past Surgical History:  Procedure Laterality Date   FEMORAL HERNIA REPAIR     KNEE ARTHROSCOPY Left    ORIF FINGER / THUMB FRACTURE      Prior to Admission medications   Medication Sig Start Date End Date Taking? Authorizing Provider  acyclovir (ZOVIRAX) 400 MG tablet Take 1 tablet (400 mg total) by mouth 5 (five) times daily. 07/22/19  Yes Sable Feil, PA-C    Allergies Patient has no known allergies.  Family History  Problem Relation Age of Onset   Graves' disease Brother    Cancer Maternal Grandfather        lung    Social History Social History   Tobacco Use   Smoking status: Never   Smokeless tobacco: Never  Substance Use Topics   Alcohol use: Yes    Alcohol/week: 3.0 standard drinks of alcohol    Types: 3 Cans of beer per week    Comment: socially   Drug use: No    Review of Systems Constitutional: No fever/chills Eyes: No visual changes. ENT: No sore throat. Cardiovascular: Denies chest pain. Respiratory: Denies shortness of breath. Gastrointestinal: No abdominal pain.  No nausea, no vomiting.  No diarrhea.  No constipation. Genitourinary: Negative for dysuria. Musculoskeletal: Negative for back pain. Skin: Negative for rash. Neurological: Negative for headaches, focal weakness or  numbness.   ____________________________________________   PHYSICAL EXAM:  VITAL SIGNS: BP is 109/75, pulse 61, respiration 12, temperature 97.6, patient 96% O2 sat on room air.  Patient weighs 126 pounds and BMI is 20.34. Constitutional: Alert and oriented. Well appearing and in no acute distress. Eyes: Conjunctivae are normal. PERRL. EOMI. Head: Atraumatic. Nose: No congestion/rhinnorhea. Mouth/Throat: Mucous membranes are moist.  Oropharynx non-erythematous. Neck: No stridor.  No cervical spine tenderness to palpation. Hematological/Lymphatic/Immunilogical: No cervical lymphadenopathy. Cardiovascular: Normal rate, regular rhythm. Grossly normal heart sounds.  Good peripheral circulation. Respiratory: Normal respiratory effort.  No retractions. Lungs CTAB. Gastrointestinal: Soft and nontender. No distention. No abdominal bruits. No CVA tenderness. Genitourinary: Deferred Musculoskeletal: No lower extremity tenderness nor edema.  No joint effusions. Neurologic:  Normal speech and language. No gross focal neurologic deficits are appreciated. No gait instability. Skin:  Skin is warm, dry and intact. No rash noted. Psychiatric: Mood and affect are normal. Speech and behavior are normal.  ____________________________________________   LABS          Component Ref Range & Units 7 d ago (07/25/21) 1 yr ago (06/16/20) 2 yr ago (03/17/19) 8 yr ago (04/19/13) 8 yr ago (01/07/13) 8 yr ago (08/20/12)  Glucose 70 - 99 mg/dL 88  99 R  98 R      Uric Acid 2.6 - 6.2 mg/dL 7.2 High   6.3 High  CM  6.2 CM      Comment:  Therapeutic target for gout patients: <6.0  BUN 6 - 24 mg/dL '14  15  13      ' Creatinine, Ser 0.57 - 1.00 mg/dL 0.74  0.88  0.88      eGFR >59 mL/min/1.73 102  84       BUN/Creatinine Ratio 9 - '23 19  17  15      ' Sodium 134 - 144 mmol/L 141  141  142      Potassium 3.5 - 5.2 mmol/L 4.3  4.3  4.4      Chloride 96 - 106 mmol/L 103  103  106      Calcium 8.7 - 10.2 mg/dL  9.2  9.3  9.1      Phosphorus 3.0 - 4.3 mg/dL 3.7  2.9 Low   3.3      Total Protein 6.0 - 8.5 g/dL 6.6  7.2  6.6      Albumin 3.8 - 4.8 g/dL 4.3  4.5  4.2      Globulin, Total 1.5 - 4.5 g/dL 2.3  2.7  2.4      Albumin/Globulin Ratio 1.2 - 2.2 1.9  1.7  1.8      Bilirubin Total 0.0 - 1.2 mg/dL 0.7  0.6  0.7      Alkaline Phosphatase 44 - 121 IU/L 41 Low   35 Low   39 R      LDH 119 - 226 IU/L 145  149  189      AST 0 - 40 IU/L '20  21  23      ' ALT 0 - 32 IU/L '22  24  22      ' GGT 0 - 60 IU/L '19  19  24      ' Iron 27 - 159 ug/dL 110  87  79      Cholesterol, Total 100 - 199 mg/dL 204 High   182  180      Triglycerides 0 - 149 mg/dL 82  57  53      HDL >39 mg/dL 60  60  50      VLDL Cholesterol Cal 5 - 40 mg/dL '15  11  10      ' LDL Chol Calc (NIH) 0 - 99 mg/dL 129 High   111 High   120 High       Chol/HDL Ratio 0.0 - 4.4 ratio 3.4  3.0 CM  3.6 CM      Comment:                                   T. Chol/HDL Ratio                                              Men  Women                                1/2 Avg.Risk  3.4    3.3                                    Avg.Risk  5.0    4.4  2X Avg.Risk  9.6    7.1                                 3X Avg.Risk 23.4   11.0   Estimated CHD Risk 0.0 - 1.0 times avg. 0.5   < 0.5 CM  0.6 CM      Comment: The CHD Risk is based on the T. Chol/HDL ratio. Other  factors affect CHD Risk such as hypertension, smoking,  diabetes, severe obesity, and family history of  premature CHD.   TSH 0.450 - 4.500 uIU/mL 2.380  2.190  2.370      T4, Total 4.5 - 12.0 ug/dL 6.5  5.9  6.7      T3 Uptake Ratio 24 - 39 % '31  27  29      ' Free Thyroxine Index 1.2 - 4.9 2.0  1.6  1.9      WBC 3.4 - 10.8 x10E3/uL 4.2  4.6  4.4  5.7 R  6.6 R  6.4 R   RBC 3.77 - 5.28 x10E6/uL 4.72  4.89  4.53  3.87 R  3.53 Low  R  4.64 R   Hemoglobin 11.1 - 15.9 g/dL 14.5  14.6  14.1  12.3 R  10.9 Low  R  13.9 R   Hematocrit 34.0 - 46.6 % 42.6  44.9  40.4  34.5 Low  R  31.1  Low  R  42.2 R   MCV 79 - 97 fL 90  92  89  89.1 R  88.1 R  90.9 R   MCH 26.6 - 33.0 pg 30.7  29.9  31.1  31.8 R  30.9 R  30.0 R   MCHC 31.5 - 35.7 g/dL 34.0  32.5  34.9  35.7 R  35.0 R  32.9 R   RDW 11.7 - 15.4 % 11.7  11.8  11.7  12.8 R  13.8 R  12.5 R   Platelets 150 - 450 x10E3/uL 272  267  295  160 R  244 R  270 R   Neutrophils Not Estab. % 46  52  42    68 R   Lymphs Not Estab. % 40  35  43      Monocytes Not Estab. % '11  10  11      ' Eos Not Estab. % '2  2  3      ' Basos Not Estab. % '1  1  1      ' Neutrophils Absolute 1.4 - 7.0 x10E3/uL 1.9  2.4  1.9    4.3 R   Lymphocytes Absolute 0.7 - 3.1 x10E3/uL 1.7  1.6  1.9    1.4 R   Monocytes Absolute 0.1 - 0.9 x10E3/uL 0.5  0.5  0.5      EOS (ABSOLUTE) 0.0 - 0.4 x10E3/uL 0.1  0.1  0.1      Basophils Absolute 0.0 - 0.2 x10E3/uL 0.0  0.1  0.0    0.0 R   Immature Granulocytes Not Estab. % 0  0  0      Immature Grans             ____________________________________________  EKG Sinus  Rhythm at 60 bpm -RSR(V1) -nondiagnostic.   PROBABLY NORMAL  ____________________________________________    ____________________________________________   INITIAL IMPRESSION / ASSESSMENT AND PLAN  As part of my medical decision making, I reviewed the following data within the [facility]  Discussed labs and EKG results with no acute findings with patient.        ____________________________________________   FINAL CLINICAL IMPRESSION  Well exam   ED Discharge Orders     None        Note:  This document was prepared using Dragon voice recognition software and may include unintentional dictation errors.

## 2021-12-31 ENCOUNTER — Other Ambulatory Visit: Payer: Self-pay

## 2021-12-31 DIAGNOSIS — J019 Acute sinusitis, unspecified: Secondary | ICD-10-CM | POA: Diagnosis not present

## 2022-01-02 DIAGNOSIS — J029 Acute pharyngitis, unspecified: Secondary | ICD-10-CM | POA: Diagnosis not present

## 2022-01-02 DIAGNOSIS — J020 Streptococcal pharyngitis: Secondary | ICD-10-CM | POA: Diagnosis not present

## 2022-01-20 DIAGNOSIS — J209 Acute bronchitis, unspecified: Secondary | ICD-10-CM | POA: Diagnosis not present

## 2022-01-31 ENCOUNTER — Ambulatory Visit: Payer: Self-pay | Admitting: Physician Assistant

## 2022-01-31 ENCOUNTER — Encounter: Payer: Self-pay | Admitting: Physician Assistant

## 2022-01-31 VITALS — BP 120/78 | HR 84 | Resp 12

## 2022-01-31 DIAGNOSIS — R059 Cough, unspecified: Secondary | ICD-10-CM

## 2022-01-31 LAB — POC COVID19 BINAXNOW: SARS Coronavirus 2 Ag: NEGATIVE

## 2022-01-31 LAB — POCT INFLUENZA A/B
Influenza A, POC: NEGATIVE
Influenza B, POC: NEGATIVE

## 2022-01-31 MED ORDER — METHYLPREDNISOLONE 4 MG PO TBPK: ORAL_TABLET | ORAL | 0 refills | Status: DC

## 2022-01-31 MED ORDER — HYDROCODONE BIT-HOMATROP MBR 5-1.5 MG/5ML PO SOLN: 5.0000 mL | Freq: Three times a day (TID) | ORAL | 0 refills | Status: DC | PRN

## 2022-01-31 MED ORDER — AZITHROMYCIN 250 MG PO TABS: ORAL_TABLET | ORAL | 0 refills | Status: AC

## 2022-01-31 NOTE — Progress Notes (Signed)
Cough x3 weeks - past couple days became productive (green phlegm) C/O loss of appetite (lost 6-7 lbs); no energy; fatigue Denies fever, body aches, headache, facial pain/pressure and sore throat  4 weeks ago Dx'd with strep throat & Tx'd with ABX - resolved  2 1/2 wks ago was traveling & called [place] & Tx'd w/ABX for the cough - didn't resolve  No flu vaccine this year  AMD

## 2022-01-31 NOTE — Progress Notes (Signed)
   Subjective: Cough and congestion    Patient ID: Margaret Rosario, female    DOB: 28-Jan-1976, 46 y.o.   MRN: [id number]  HPI Patient states 2 weeks of cough and congestion.  Patient states developed strep pharyngitis last week and November treated with steroids and antibiotics.  Patient states felt better until recent cold symptoms.  Patient also states body aches, headache, and facial pressure/pain.  There is also decreased appetite and fatigue.  No recent travel or known contact with COVID-19.  Immunization for dysuria.  Patient tested negative for influenza and COVID-19 today.   Review of Systems Negative except for above complaints    Objective:   Physical Exam  BP is 120/78, pulse 84, respiration 12, patient 95% O2 sat on room air. HEENT is remarkable for bilateral maxillary guarding.  Edematous nasal turbinates.  Postnasal drainage.  Pharynx is nonerythematous. Neck is supple followed lymphadenectomy or bruits. Lungs with Rales and increased cough with deep inspirations. Heart regular rate and rhythm.     Assessment & Plan: Respiratory infection  Patient given a prescription for Z-Pak, Medrol Dosepak, and codeine cough medicine.  Patient advised to follow-up in 5 days if no improvement or worsening of complaint.

## 2022-02-01 ENCOUNTER — Other Ambulatory Visit: Payer: Self-pay | Admitting: Physician Assistant

## 2022-02-01 ENCOUNTER — Telehealth: Payer: Self-pay

## 2022-02-01 MED ORDER — HYDROCODONE BIT-HOMATROP MBR 5-1.5 MG/5ML PO SOLN: 5.0000 mL | Freq: Three times a day (TID) | ORAL | 0 refills | Status: DC | PRN

## 2022-04-08 ENCOUNTER — Other Ambulatory Visit: Payer: Self-pay | Admitting: Physician Assistant

## 2022-04-08 DIAGNOSIS — J020 Streptococcal pharyngitis: Secondary | ICD-10-CM

## 2022-04-08 DIAGNOSIS — J029 Acute pharyngitis, unspecified: Secondary | ICD-10-CM

## 2022-04-08 DIAGNOSIS — R52 Pain, unspecified: Secondary | ICD-10-CM | POA: Insufficient documentation

## 2022-04-08 DIAGNOSIS — R509 Fever, unspecified: Secondary | ICD-10-CM

## 2022-04-08 DIAGNOSIS — R0981 Nasal congestion: Secondary | ICD-10-CM

## 2022-04-08 LAB — POC COVID19 BINAXNOW: SARS Coronavirus 2 Ag: NEGATIVE

## 2022-04-08 LAB — POCT RAPID STREP A (OFFICE): Rapid Strep A Screen: POSITIVE — AB

## 2022-04-08 LAB — POCT INFLUENZA A/B
Influenza A, POC: NEGATIVE
Influenza B, POC: NEGATIVE

## 2022-04-08 MED ORDER — LIDOCAINE VISCOUS HCL 2 % MT SOLN: 5.0000 mL | Freq: Four times a day (QID) | OROMUCOSAL | 0 refills | Status: DC | PRN

## 2022-04-08 MED ORDER — AMOXICILLIN 875 MG PO TABS: 875.0000 mg | ORAL_TABLET | Freq: Two times a day (BID) | ORAL | 0 refills | Status: AC

## 2022-04-08 MED ORDER — PROMETHAZINE-DM 6.25-15 MG/5ML PO SYRP: 5.0000 mL | ORAL_SOLUTION | Freq: Four times a day (QID) | ORAL | 0 refills | Status: DC | PRN

## 2022-04-08 NOTE — Addendum Note (Signed)
Addended by: Sable Feil on: 04/08/2022 04:58 PM   Modules accepted: Orders

## 2022-04-08 NOTE — Progress Notes (Signed)
Stated day 3 complains of fever, sore throat, body aches and neck discomfort.  Denies cough with very mild congestion present.  Stated only taking some Ibuprofen and has had strept throat within the last year.  Tested for covid/flu/strept with positive strept results given to provider and patient for provider televisit.

## 2022-04-08 NOTE — Progress Notes (Signed)
   Subjective:Sore throat    Patient ID: Margaret Rosario, female    DOB: 02/22/1975, 47 y.o.   MRN: [id number]  HPI Patient complains of sore throat which increased eating and drinking..  Patient test positive strep pharyngitis.  Review of Systems Negative except for above complaint    Objective:   Physical Exam Physical exam deferred secondary to telephonic encounter.  Patient test positive for strep pharyngitis earlier today.       Assessment & Plan: Strep pharyngitis   Patient given prescription for amoxicillin, Phenergan DM, and viscous lidocaine.

## 2022-04-09 ENCOUNTER — Ambulatory Visit: Payer: Self-pay

## 2022-04-09 DIAGNOSIS — J069 Acute upper respiratory infection, unspecified: Secondary | ICD-10-CM

## 2022-04-09 MED ORDER — TRIAMCINOLONE ACETONIDE 40 MG/ML IJ SUSP
40.0000 mg | Freq: Once | INTRAMUSCULAR | Status: AC
  Administered 2022-04-09: 40 mg via INTRAMUSCULAR

## 2022-06-20 ENCOUNTER — Ambulatory Visit: Payer: Self-pay

## 2022-06-20 DIAGNOSIS — Z0000 Encounter for general adult medical examination without abnormal findings: Secondary | ICD-10-CM

## 2022-06-20 LAB — POCT URINALYSIS DIPSTICK
Bilirubin, UA: NEGATIVE
Blood, UA: NEGATIVE
Glucose, UA: NEGATIVE
Ketones, UA: NEGATIVE
Nitrite, UA: NEGATIVE
Protein, UA: NEGATIVE
Spec Grav, UA: 1.015 (ref 1.010–1.025)
Urobilinogen, UA: 0.2 E.U./dL
pH, UA: 6 (ref 5.0–8.0)

## 2022-06-21 LAB — CMP12+LP+TP+TSH+6AC+CBC/D/PLT
ALT: 19 IU/L (ref 0–32)
AST: 18 IU/L (ref 0–40)
Albumin/Globulin Ratio: 1.7 (ref 1.2–2.2)
Albumin: 4.5 g/dL (ref 3.9–4.9)
Alkaline Phosphatase: 37 IU/L — ABNORMAL LOW (ref 44–121)
BUN/Creatinine Ratio: 20 (ref 9–23)
BUN: 17 mg/dL (ref 6–24)
Basophils Absolute: 0 10*3/uL (ref 0.0–0.2)
Basos: 1 %
Bilirubin Total: 1 mg/dL (ref 0.0–1.2)
Calcium: 9.3 mg/dL (ref 8.7–10.2)
Chloride: 101 mmol/L (ref 96–106)
Chol/HDL Ratio: 3.5 ratio (ref 0.0–4.4)
Cholesterol, Total: 201 mg/dL — ABNORMAL HIGH (ref 100–199)
Creatinine, Ser: 0.84 mg/dL (ref 0.57–1.00)
EOS (ABSOLUTE): 0 10*3/uL (ref 0.0–0.4)
Eos: 0 %
Estimated CHD Risk: 0.6 times avg. (ref 0.0–1.0)
Free Thyroxine Index: 2.3 (ref 1.2–4.9)
GGT: 20 IU/L (ref 0–60)
Globulin, Total: 2.6 g/dL (ref 1.5–4.5)
Glucose: 86 mg/dL (ref 70–99)
HDL: 57 mg/dL (ref >39)
Hematocrit: 40.7 % (ref 34.0–46.6)
Hemoglobin: 13.8 g/dL (ref 11.1–15.9)
Immature Grans (Abs): 0 10*3/uL (ref 0.0–0.1)
Immature Granulocytes: 0 %
Iron: 160 ug/dL — ABNORMAL HIGH (ref 27–159)
LDH: 151 IU/L (ref 119–226)
LDL Chol Calc (NIH): 129 mg/dL — ABNORMAL HIGH (ref 0–99)
Lymphocytes Absolute: 1.8 10*3/uL (ref 0.7–3.1)
Lymphs: 33 %
MCH: 30.3 pg (ref 26.6–33.0)
MCHC: 33.9 g/dL (ref 31.5–35.7)
MCV: 89 fL (ref 79–97)
Monocytes Absolute: 0.4 10*3/uL (ref 0.1–0.9)
Monocytes: 8 %
Neutrophils Absolute: 3.1 10*3/uL (ref 1.4–7.0)
Neutrophils: 58 %
Phosphorus: 2.9 mg/dL — ABNORMAL LOW (ref 3.0–4.3)
Platelets: 272 10*3/uL (ref 150–450)
Potassium: 4.1 mmol/L (ref 3.5–5.2)
RBC: 4.56 x10E6/uL (ref 3.77–5.28)
RDW: 12.1 % (ref 11.7–15.4)
Sodium: 135 mmol/L (ref 134–144)
T3 Uptake Ratio: 30 % (ref 24–39)
T4, Total: 7.8 ug/dL (ref 4.5–12.0)
TSH: 1.43 u[IU]/mL (ref 0.450–4.500)
Total Protein: 7.1 g/dL (ref 6.0–8.5)
Triglycerides: 84 mg/dL (ref 0–149)
Uric Acid: 6.6 mg/dL — ABNORMAL HIGH (ref 2.6–6.2)
VLDL Cholesterol Cal: 15 mg/dL (ref 5–40)
WBC: 5.3 10*3/uL (ref 3.4–10.8)
eGFR: 87 mL/min/{1.73_m2} (ref >59)

## 2022-06-27 ENCOUNTER — Encounter: Payer: Self-pay | Admitting: Physician Assistant

## 2022-07-03 ENCOUNTER — Ambulatory Visit: Payer: Self-pay | Admitting: Physician Assistant

## 2022-07-03 ENCOUNTER — Encounter: Payer: Self-pay | Admitting: Physician Assistant

## 2022-07-03 VITALS — BP 111/82 | HR 64 | Temp 97.1°F | Resp 12 | Ht 66.0 in | Wt 130.0 lb

## 2022-07-03 DIAGNOSIS — M79671 Pain in right foot: Secondary | ICD-10-CM

## 2022-07-03 DIAGNOSIS — M21619 Bunion of unspecified foot: Secondary | ICD-10-CM

## 2022-07-03 DIAGNOSIS — Z0000 Encounter for general adult medical examination without abnormal findings: Secondary | ICD-10-CM

## 2022-07-03 NOTE — Progress Notes (Signed)
[facility]  __________________________   None    (approximate)  I have reviewed the triage vital signs and the nursing notes.   HISTORY  Chief Complaint Annual Exam   HPI Margaret Rosario is a 47 y.o. female patient presents for annual physical exam.  Voices concern for right foot pain.         Past Medical History:  Diagnosis Date   AMA (advanced maternal age) multigravida 35+     Patient Active Problem List   Diagnosis Date Noted   Streptococcal sore throat 04/08/2022   Body aches 04/08/2022   Pregnancy 04/19/2013   Fetal microcephaly affecting antepartum care of mother 03/01/2013   Advanced maternal age, primigravida, antepartum 08/20/2012    Past Surgical History:  Procedure Laterality Date   FEMORAL HERNIA REPAIR     KNEE ARTHROSCOPY Left    ORIF FINGER / THUMB FRACTURE      Prior to Admission medications   Medication Sig Start Date End Date Taking? Authorizing Provider  acyclovir (ZOVIRAX) 400 MG tablet Take 1 tablet (400 mg total) by mouth 5 (five) times daily. 07/22/19   Joni Reining, PA-C  lidocaine (XYLOCAINE) 2 % solution Use as directed 5 mLs in the mouth or throat every 6 (six) hours as needed for mouth pain. Mix with 5 mL of viscous 04/08/22   Joni Reining, PA-C  promethazine-dextromethorphan (PROMETHAZINE-DM) 6.25-15 MG/5ML syrup Take 5 mLs by mouth 4 (four) times daily as needed for cough. 04/08/22   Joni Reining, PA-C    Allergies Patient has no known allergies.  Family History  Problem Relation Age of Onset   Graves' disease Brother    Cancer Maternal Grandfather        lung    Social History Social History   Tobacco Use   Smoking status: Never   Smokeless tobacco: Never  Substance Use Topics   Alcohol use: Yes    Alcohol/week: 3.0 standard drinks of alcohol    Types: 3 Cans of beer per week    Comment: socially   Drug use: No    Review of Systems Constitutional: No  fever/chills Eyes: No visual changes. ENT: No sore throat. Cardiovascular: Denies chest pain. Respiratory: Denies shortness of breath. Gastrointestinal: No abdominal pain.  No nausea, no vomiting.  No diarrhea.  No constipation. Genitourinary: Negative for dysuria. Musculoskeletal: Negative for back pain.  Right foot pain. Skin: Negative for rash. Neurological: Negative for headaches, focal weakness or numbness.  ____________________________________________   PHYSICAL EXAM: VITAL SIGNS: BP 111/82  BP Location Left Arm  Patient Position Sitting  Cuff Size Normal  Pulse 64  Resp 12  Temp 97.1 F (36.2 C)  Temp src Temporal  SpO2 100 %  Weight 130 lb (59 kg)  Height 5\' 6"  (1.676 m   BMI 20.98 kg/m2  BSA 1.66 m2     Constitutional: Alert and oriented. Well appearing and in no acute distress. Eyes: Conjunctivae are normal. PERRL. EOMI. Head: Atraumatic. Nose: No congestion/rhinnorhea. Mouth/Throat: Mucous membranes are moist.  Oropharynx non-erythematous. Neck: No stridor.  No cervical spine tenderness to palpation. Hematological/Lymphatic/Immunilogical: No cervical lymphadenopathy. Cardiovascular: Normal rate, regular rhythm. Grossly normal heart sounds.  Good peripheral circulation. Respiratory: Normal respiratory effort.  No retractions. Lungs CTAB. Gastrointestinal: Soft and nontender. No distention. No abdominal bruits. No CVA tenderness. Genitourinary: Deferred Musculoskeletal: No lower extremity tenderness nor edema.  Moderate right hallux valgus.  no joint effusions. Neurologic:  Normal speech and language.  No gross focal neurologic deficits are appreciated. No gait instability. Skin:  Skin is warm, dry and intact. No rash noted. Psychiatric: Mood and affect are normal. Speech and behavior are normal.  ____________________________________________   LABS        Component Ref Range & Units 13 d ago 11 mo ago 2 yr ago 3 yr ago  Color, UA Pale Yellow yellow  Light Yellow yellow  Clarity, UA Clear clear Clear clear  Glucose, UA Negative Negative Negative Negative Negative  Bilirubin, UA Negative negative Negative negative  Ketones, UA Negative negative Negative negative  Spec Grav, UA 1.010 - 1.025 1.015 >=1.030 Abnormal  1.020 1.025  Blood, UA Negative negative Negative negative  pH, UA 5.0 - 8.0 6.0 6.0 6.5 6.0  Protein, UA Negative Negative Negative Negative Negative  Urobilinogen, UA 0.2 or 1.0 E.U./dL 0.2 0.2 0.2 0.2  Nitrite, UA Negative negative Negative negative  Leukocytes, UA Negative Trace Abnormal  Negative Negative Negative  Appearance      Odor                       Component Ref Range & Units 13 d ago (06/20/22) 11 mo ago (07/25/21) 2 yr ago (06/16/20) 3 yr ago (03/17/19) 9 yr ago (04/19/13) 9 yr ago (01/07/13) 9 yr ago (08/20/12)  Glucose 70 - 99 mg/dL 86 88 99 R 98 R     Uric Acid 2.6 - 6.2 mg/dL 6.6 High  7.2 High  CM 6.3 High  CM 6.2 CM     Comment:            Therapeutic target for gout patients: <6.0  BUN 6 - 24 mg/dL 17 14 15 13      Creatinine, Ser 0.57 - 1.00 mg/dL 1.61 0.96 0.45 4.09     eGFR >59 mL/min/1.73 87 102 84      BUN/Creatinine Ratio 9 - 23 20 19 17 15      Sodium 134 - 144 mmol/L 135 141 141 142     Potassium 3.5 - 5.2 mmol/L 4.1 4.3 4.3 4.4     Chloride 96 - 106 mmol/L 101 103 103 106     Calcium 8.7 - 10.2 mg/dL 9.3 9.2 9.3 9.1     Phosphorus 3.0 - 4.3 mg/dL 2.9 Low  3.7 2.9 Low  3.3     Total Protein 6.0 - 8.5 g/dL 7.1 6.6 7.2 6.6     Albumin 3.9 - 4.9 g/dL 4.5 4.3 R 4.5 R 4.2 R     Globulin, Total 1.5 - 4.5 g/dL 2.6 2.3 2.7 2.4     Albumin/Globulin Ratio 1.2 - 2.2 1.7 1.9 1.7 1.8     Bilirubin Total 0.0 - 1.2 mg/dL 1.0 0.7 0.6 0.7     Alkaline Phosphatase 44 - 121 IU/L 37 Low  41 Low  35 Low  39 R     LDH 119 - 226 IU/L 151 145 149 189     AST 0 - 40 IU/L 18 20 21 23      ALT 0 - 32 IU/L 19 22 24 22      GGT 0 - 60 IU/L 20 19 19 24      Iron 27 - 159 ug/dL 811 High   914 87 79     Cholesterol, Total 100 - 199 mg/dL 782 High  956 High  213 180     Triglycerides 0 - 149 mg/dL 84 82 57 53     HDL >08  mg/dL 57 60 60 50     VLDL Cholesterol Cal 5 - 40 mg/dL 15 15 11 10      LDL Chol Calc (NIH) 0 - 99 mg/dL 409 High  811 High  914 High  120 High      Chol/HDL Ratio 0.0 - 4.4 ratio 3.5 3.4 CM 3.0 CM 3.6 CM     Comment:                                   T. Chol/HDL Ratio                                             Men  Women                               1/2 Avg.Risk  3.4    3.3                                   Avg.Risk  5.0    4.4                                2X Avg.Risk  9.6    7.1                                3X Avg.Risk 23.4   11.0  Estimated CHD Risk 0.0 - 1.0 times avg. 0.6 0.5 CM  < 0.5 CM 0.6 CM     Comment: The CHD Risk is based on the T. Chol/HDL ratio. Other factors affect CHD Risk such as hypertension, smoking, diabetes, severe obesity, and family history of premature CHD.  TSH 0.450 - 4.500 uIU/mL 1.430 2.380 2.190 2.370     T4, Total 4.5 - 12.0 ug/dL 7.8 6.5 5.9 6.7     T3 Uptake Ratio 24 - 39 % 30 31 27 29      Free Thyroxine Index 1.2 - 4.9 2.3 2.0 1.6 1.9     WBC 3.4 - 10.8 x10E3/uL 5.3 4.2 4.6 4.4 5.7 R 6.6 R 6.4 R  RBC 3.77 - 5.28 x10E6/uL 4.56 4.72 4.89 4.53 3.87 R 3.53 Low  R 4.64 R  Hemoglobin 11.1 - 15.9 g/dL 78.2 95.6 21.3 08.6 57.8 R 10.9 Low  R 13.9 R  Hematocrit 34.0 - 46.6 % 40.7 42.6 44.9 40.4 34.5 Low  R 31.1 Low  R 42.2 R  MCV 79 - 97 fL 89 90 92 89 89.1 R 88.1 R 90.9 R  MCH 26.6 - 33.0 pg 30.3 30.7 29.9 31.1 31.8 R 30.9 R 30.0 R  MCHC 31.5 - 35.7 g/dL 46.9 62.9 52.8 41.3 24.4 R 35.0 R 32.9 R  RDW 11.7 - 15.4 % 12.1 11.7 11.8 11.7 12.8 R 13.8 R 12.5 R  Platelets 150 - 450 x10E3/uL 272 272 267 295 160 R 244 R 270 R  Neutrophils Not Estab. % 58 46 52 42   68 R  Lymphs Not Estab. % 33 40 35 43     Monocytes Not Estab. % 8 11 10  11  Eos Not Estab. % 0 2 2 3      Basos Not Estab. % 1 1 1 1       Neutrophils Absolute 1.4 - 7.0 x10E3/uL 3.1 1.9 2.4 1.9   4.3 R  Lymphocytes Absolute 0.7 - 3.1 x10E3/uL 1.8 1.7 1.6 1.9   1.4 R  Monocytes Absolute 0.1 - 0.9 x10E3/uL 0.4 0.5 0.5 0.5     EOS (ABSOLUTE) 0.0 - 0.4 x10E3/uL 0.0 0.1 0.1 0.1     Basophils Absolute 0.0 - 0.2 x10E3/uL 0.0 0.0 0.1 0.0   0.0 R  Immature Granulocytes Not Estab. % 0 0 0 0     Immature Grans (Abs)              ____________________________________________  EKG  Sinus rhythm at 61 bpm ____________________________________________    ____________________________________________   INITIAL IMPRESSION / ASSESSMENT AND PLAN   As part of my medical decision making, I reviewed the following data within the electronic MEDICAL RECORD NUMBER      No acute findings except for hallux valgus on physical exam and labs.  Consulted to podiatry       ____________________________________________   FINAL CLINICAL IMPRESSION  Well exam   ED Discharge Orders     None        Note:  This document was prepared using Dragon voice recognition software and may include unintentional dictation errors.

## 2022-07-17 ENCOUNTER — Ambulatory Visit (INDEPENDENT_AMBULATORY_CARE_PROVIDER_SITE_OTHER): Payer: 59

## 2022-07-17 ENCOUNTER — Ambulatory Visit (INDEPENDENT_AMBULATORY_CARE_PROVIDER_SITE_OTHER): Payer: 59 | Admitting: Podiatry

## 2022-07-17 ENCOUNTER — Other Ambulatory Visit: Payer: Self-pay | Admitting: Podiatry

## 2022-07-17 ENCOUNTER — Encounter: Payer: Self-pay | Admitting: Podiatry

## 2022-07-17 DIAGNOSIS — M778 Other enthesopathies, not elsewhere classified: Secondary | ICD-10-CM

## 2022-07-17 DIAGNOSIS — M2011 Hallux valgus (acquired), right foot: Secondary | ICD-10-CM

## 2022-07-17 DIAGNOSIS — M7751 Other enthesopathy of right foot: Secondary | ICD-10-CM

## 2022-07-17 MED ORDER — DEXAMETHASONE SODIUM PHOSPHATE 120 MG/30ML IJ SOLN
2.0000 mg | Freq: Once | INTRAMUSCULAR | Status: AC
Start: 2022-07-17 — End: 2022-07-17
  Administered 2022-07-17: 2 mg via INTRA_ARTICULAR

## 2022-07-17 NOTE — Progress Notes (Signed)
  Subjective:  Patient ID: Margaret Rosario, female    DOB: 19-Nov-1975,  MRN: [id number] HPI Chief Complaint  Patient presents with   Foot Pain    1st MPJ right - aching x several weeks, bunion deformity, avulsion fracture that went untreated in high school-was active in basketball at the time   New Patient (Initial Visit)    47 y.o. female presents with the above complaint.   ROS: Denies fever chills nausea vomit muscle aches pains calf pain back pain chest pain shortness of breath.  Past Medical History:  Diagnosis Date   AMA (advanced maternal age) multigravida 35+    Past Surgical History:  Procedure Laterality Date   FEMORAL HERNIA REPAIR     KNEE ARTHROSCOPY Left    ORIF FINGER / THUMB FRACTURE     No current outpatient medications on file.  No Known Allergies Review of Systems Objective:  There were no vitals filed for this visit.  General: Well developed, nourished, in no acute distress, alert and oriented x3   Dermatological: Skin is warm, dry and supple bilateral. Nails x 10 are well maintained; remaining integument appears unremarkable at this time. There are no open sores, no preulcerative lesions, no rash or signs of infection present.  Vascular: Dorsalis Pedis artery and Posterior Tibial artery pedal pulses are 2/4 bilateral with immedate capillary fill time. Pedal hair growth present. No varicosities and no lower extremity edema present bilateral.   Neruologic: Grossly intact via light touch bilateral. Vibratory intact via tuning fork bilateral. Protective threshold with Semmes Wienstein monofilament intact to all pedal sites bilateral. Patellar and Achilles deep tendon reflexes 2+ bilateral. No Babinski or clonus noted bilateral.   Musculoskeletal: No gross boney pedal deformities bilateral. No pain, crepitus, or limitation noted with foot and ankle range of motion bilateral. Muscular strength 5/5 in all groups tested bilateral.  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  This is reducible in nature.  Though she does have tenderness on distraction of the toe and palpation of the joint simultaneously.  Gait: Unassisted, Nonantalgic.    Radiographs:  Radiographs taken today demonstrate osseously mature individual with good bone mineralization.  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  No cystic deformity is noted.  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  Plan: After sterile Betadine skin prep I injected the first metatarsophalangeal joint of the right foot.  We discussed etiology pathology and surgical therapies discussed possible need for orthotics.  We injected her with 4 mg of dexamethasone local anesthetic may need use Kenalog next visit.     Xochitl Egle T. [place], [place]

## 2022-08-05 ENCOUNTER — Other Ambulatory Visit: Payer: Self-pay | Admitting: Physician Assistant

## 2022-08-05 ENCOUNTER — Encounter: Payer: Self-pay | Admitting: Physician Assistant

## 2022-08-05 DIAGNOSIS — N6489 Other specified disorders of breast: Secondary | ICD-10-CM

## 2022-09-03 ENCOUNTER — Ambulatory Visit
Admission: RE | Admit: 2022-09-03 | Discharge: 2022-09-03 | Disposition: A | Discharge status: Home / Self Care | Payer: 59 | Source: Ambulatory Visit | Attending: Physician Assistant | Admitting: Physician Assistant

## 2022-09-03 DIAGNOSIS — N6489 Other specified disorders of breast: Secondary | ICD-10-CM | POA: Diagnosis present

## 2022-09-11 ENCOUNTER — Ambulatory Visit: Payer: 59 | Admitting: Podiatry

## 2022-11-11 ENCOUNTER — Ambulatory Visit: Payer: 59 | Admitting: Podiatry

## 2022-12-18 ENCOUNTER — Ambulatory Visit (INDEPENDENT_AMBULATORY_CARE_PROVIDER_SITE_OTHER): Payer: 59 | Admitting: Podiatry

## 2022-12-18 ENCOUNTER — Encounter: Payer: Self-pay | Admitting: Podiatry

## 2022-12-18 DIAGNOSIS — M2011 Hallux valgus (acquired), right foot: Secondary | ICD-10-CM

## 2022-12-18 NOTE — Progress Notes (Signed)
She presents today states that the shot really did not help that much she just eased the pain for a little while but every time I am active on this and wanting to participate in basketball or any type of high power activities the foot is painful and makes the entire leg painful.  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  Objective: Vital signs are stable alert oriented x 3 I reviewed her past medical history medications allergies surgeries and social history.  She has pain on range of motion of the first metatarsal phalangeal joint of the right foot there is some crepitation on the top end of the joint with moderate hallux limitus.  Radiographs were reviewed once again today.  Assessment: Pain in limb secondary to hallux abductovalgus deformity of the right foot.  Plan: At this point we consented her for an [facility] osteotomy with screw.  We discussed this in great detail today she knows how long she will take before should be able to go back to work.  We did discuss the possible complications which may include but are not limited to postop pain bleeding swelling infection recurrence need further surgery overcorrection under correction loss of digit loss limb loss of life.  She was provided information regarding the surgery center and a cam boot as well as anesthesia group.

## 2023-01-15 ENCOUNTER — Telehealth: Payer: Self-pay | Admitting: Podiatry

## 2023-01-15 NOTE — Telephone Encounter (Signed)
DOS-02/07/23  Margaret Rosario [id number]  [employer] EFFECTIVE DATE- 08/05/2018  DEDUCTIBLE- $500.00 WITH REMAINING $0.00 OOP-$2000.00 WITH REMAINING $1,465.79  COINSURANCE- 10%  SPOKE WITH MAHARNI C. FROM [employer] AND SHE STATED THAT PRIOR AUTH IS NOT REQUIRED FOR CPT CODE 82423.  CALL REFERENCE NUMBER: [id number]

## 2023-02-06 ENCOUNTER — Other Ambulatory Visit: Payer: Self-pay | Admitting: Podiatry

## 2023-02-06 MED ORDER — OXYCODONE-ACETAMINOPHEN 10-325 MG PO TABS: 1.0000 | ORAL_TABLET | Freq: Three times a day (TID) | ORAL | 0 refills | Status: AC | PRN

## 2023-02-06 MED ORDER — ONDANSETRON HCL 4 MG PO TABS: 4.0000 mg | ORAL_TABLET | Freq: Three times a day (TID) | ORAL | 0 refills | Status: DC | PRN

## 2023-02-06 MED ORDER — CEPHALEXIN 500 MG PO CAPS: 500.0000 mg | ORAL_CAPSULE | Freq: Three times a day (TID) | ORAL | 0 refills | Status: DC

## 2023-02-07 DIAGNOSIS — M2011 Hallux valgus (acquired), right foot: Secondary | ICD-10-CM | POA: Diagnosis not present

## 2023-02-12 ENCOUNTER — Encounter: Payer: 59 | Admitting: Podiatry

## 2023-02-12 ENCOUNTER — Ambulatory Visit (INDEPENDENT_AMBULATORY_CARE_PROVIDER_SITE_OTHER): Payer: 59

## 2023-02-12 ENCOUNTER — Encounter: Payer: Self-pay | Admitting: Podiatry

## 2023-02-12 ENCOUNTER — Ambulatory Visit (INDEPENDENT_AMBULATORY_CARE_PROVIDER_SITE_OTHER): Payer: 59 | Admitting: Podiatry

## 2023-02-12 DIAGNOSIS — M2011 Hallux valgus (acquired), right foot: Secondary | ICD-10-CM | POA: Diagnosis not present

## 2023-02-12 DIAGNOSIS — Z9889 Other specified postprocedural states: Secondary | ICD-10-CM

## 2023-02-12 NOTE — Progress Notes (Signed)
  Subjective:  Patient ID: Margaret Rosario, female    DOB: 12/28/1975,  MRN: [id number]  Chief Complaint  Patient presents with   Routine Post Op    It's doing okay.  The pain comes and goes.  It gets up to a seven and then goes down to a two or three when I take medicine or stay off it for a while.    DOS: 02/07/23 Procedure: R foot bunionectomy  48 y.o. female returns for post-op check. Doing well  Review of Systems: Negative except as noted in the HPI. Denies N/V/F/Ch.   Objective:  There were no vitals filed for this visit. There is no height or weight on file to calculate BMI. Constitutional Well developed. Well nourished.  Vascular Foot warm and well perfused. Capillary refill normal to all digits.  Calf is soft and supple, no posterior calf or knee pain, negative Homans' sign  Neurologic Normal speech. Oriented to person, place, and time. Epicritic sensation to light touch grossly present bilaterally.  Dermatologic Skin healing well without signs of infection. Skin edges well coapted without signs of infection.  Orthopedic: Tenderness to palpation noted about the surgical site.   Multiple view plain film radiographs: correction of hallux valgus with screw fixation no complications noted Assessment:   1. Status post surgery    Plan:  Patient was evaluated and treated and all questions answered.  S/p foot surgery R -Progressing as expected post-operatively. -XR: noted above no complication -WB Status: WBAT in CAM -Sutures: return 2 weeks to remove. -Medications: no refills necessary -Foot redressed. OK to remove Monday to shower   Return in about 2 weeks (around 02/26/2023) for suture removal.

## 2023-02-26 ENCOUNTER — Encounter: Payer: 59 | Admitting: Podiatry

## 2023-02-26 ENCOUNTER — Encounter: Payer: Self-pay | Admitting: Podiatry

## 2023-02-26 ENCOUNTER — Other Ambulatory Visit: Payer: Self-pay

## 2023-02-26 ENCOUNTER — Ambulatory Visit (INDEPENDENT_AMBULATORY_CARE_PROVIDER_SITE_OTHER): Payer: 59 | Admitting: Podiatry

## 2023-02-26 DIAGNOSIS — M2011 Hallux valgus (acquired), right foot: Secondary | ICD-10-CM

## 2023-02-26 DIAGNOSIS — B001 Herpesviral vesicular dermatitis: Secondary | ICD-10-CM

## 2023-02-26 MED ORDER — ACYCLOVIR 400 MG PO TABS: 400.0000 mg | ORAL_TABLET | Freq: Every day | ORAL | 1 refills | Status: DC

## 2023-02-26 NOTE — Telephone Encounter (Signed)
Good morning,  I hope you are well! I was wondering if you could have Ron call in the below prescription for me today. He has prescribed it in the past and I have been out for quite some time and now need it. Please let me know if y'all could help me. Thank you!       Shelly

## 2023-02-27 NOTE — Progress Notes (Signed)
  Subjective:  Patient ID: Margaret Rosario, female    DOB: 01-08-1976,  MRN: [id number]  Chief Complaint  Patient presents with   Routine Post Op    POV # 2 DOS 02/07/23 --- RIGHT FOOT [place] BUNIONECTOMY WITH SCREW FIXATION "I'm okay, I just didn't expect this to take this much of a toll on me."     DOS: 02/07/23 Procedure: R foot bunionectomy  48 y.o. female returns for post-op check.   Review of Systems: Negative except as noted in the HPI. Denies N/V/F/Ch.   Objective:  There were no vitals filed for this visit. There is no height or weight on file to calculate BMI. Constitutional Well developed. Well nourished.  Vascular Foot warm and well perfused. Capillary refill normal to all digits.  Calf is soft and supple, no posterior calf or knee pain, negative Homans' sign  Neurologic Normal speech. Oriented to person, place, and time. Epicritic sensation to light touch grossly present bilaterally.  Dermatologic Incision well-healed not hypertrophic  Orthopedic: Little to no pain to palpation noted about the surgical site.   Multiple view plain film radiographs: correction of hallux valgus with screw fixation no complications noted Assessment:   1. Hallux valgus of right foot    Plan:  Patient was evaluated and treated and all questions answered.  S/p foot surgery R -Sutures removed uneventfully.  Continue weightbearing in cam boot only.  Emphasized importance of range of motion exercises of the digit.  Regular bathing and showering.  Compression sleeve applied.  Follow-up as scheduled with Dr. Al Corpus for final radiographs and return to shoe gear shortly after  No follow-ups on file.

## 2023-03-19 ENCOUNTER — Encounter: Payer: Self-pay | Admitting: Podiatry

## 2023-03-19 ENCOUNTER — Ambulatory Visit (INDEPENDENT_AMBULATORY_CARE_PROVIDER_SITE_OTHER): Payer: 59

## 2023-03-19 ENCOUNTER — Ambulatory Visit (INDEPENDENT_AMBULATORY_CARE_PROVIDER_SITE_OTHER): Payer: 59 | Admitting: Podiatry

## 2023-03-19 DIAGNOSIS — Z9889 Other specified postprocedural states: Secondary | ICD-10-CM

## 2023-03-19 DIAGNOSIS — M2011 Hallux valgus (acquired), right foot: Secondary | ICD-10-CM | POA: Diagnosis not present

## 2023-03-19 NOTE — Progress Notes (Signed)
She presents with her husband today for her second postop visit date of surgery is 02/07/2023.  Status post [facility] bunion repair of her right foot.  States that I am okay I just did not expected to take this much out of me.  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  She states that she was on her foot quite a bit and now she is happy to be back in her boot for a while.  Objective: Vitals are stable alert oriented x 3 there is no erythema edema salines drainage odor incision sites healing very nicely small scab is remaining but appears to be peeling off.  She has limited range of motion or dorsiflexion and plantarflexion.  Radiographs taken today demonstrate an osseously mature individual internal fixation is intact and is provide good compression.  Assessment well-healing surgical foot.  Plan: Recommended that she try to get back into a tennis shoe over the next few days possibly back and forth with the boot and follow-up with me in 2 to 3 weeks at which time I fully expect her to be in a tennis shoe.  If the joint is not more loose than it is we will send to physical therapy.

## 2023-04-09 ENCOUNTER — Ambulatory Visit (INDEPENDENT_AMBULATORY_CARE_PROVIDER_SITE_OTHER): Payer: 59 | Admitting: Podiatry

## 2023-04-09 ENCOUNTER — Ambulatory Visit (INDEPENDENT_AMBULATORY_CARE_PROVIDER_SITE_OTHER)

## 2023-04-09 ENCOUNTER — Encounter: Payer: Self-pay | Admitting: Podiatry

## 2023-04-09 DIAGNOSIS — M2011 Hallux valgus (acquired), right foot: Secondary | ICD-10-CM

## 2023-04-09 DIAGNOSIS — Z9889 Other specified postprocedural states: Secondary | ICD-10-CM

## 2023-04-09 NOTE — Progress Notes (Signed)
 He presents today date of surgery February 07, 2023 with a [place] bunionectomy and screw fixation.  She states still has some numbness of the top of the foot but all in all she is doing better.  She states that she is only a lot which causes to swell and she feels that contributes to some numbness as well.  All in all she feels that is coming along. Objective: Vital signs are stable alert and oriented x 3.  Pulses are palpable.  There is no erythematous mild edema to the sore site.  Scar appears to be bound tight particularly around the capsular portion of the joint.  She has good range of motion though still limited on plantarflexion and some dorsiflexion.  Radiographs taken today demonstrate nice mature individual with retention of screw to the first metatarsal of the right foot.  Bone healing appears to be normal and remodeling has started.  Assessment: Well-healing surgical foot 2 months  Plan: Encouraged range of motion exercises massage therapy to help her get started.  Will her to get back to riding her bicycle which I do believe would help break up scar tissue around the joint.  She questions concerns she will notify us otherwise I will follow-up with her for another set of x-rays 1 month.

## 2023-05-07 ENCOUNTER — Encounter: Payer: Self-pay | Admitting: Podiatry

## 2023-05-07 ENCOUNTER — Ambulatory Visit (INDEPENDENT_AMBULATORY_CARE_PROVIDER_SITE_OTHER): Admitting: Podiatry

## 2023-05-07 ENCOUNTER — Ambulatory Visit (INDEPENDENT_AMBULATORY_CARE_PROVIDER_SITE_OTHER)

## 2023-05-07 DIAGNOSIS — M2011 Hallux valgus (acquired), right foot: Secondary | ICD-10-CM

## 2023-05-07 DIAGNOSIS — Z9889 Other specified postprocedural states: Secondary | ICD-10-CM

## 2023-05-07 NOTE — Progress Notes (Signed)
 She presents today date of surgery February 07, 2023 [facility] bunion repair right foot.  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  She states that there are only a few things that she is not able to do yet but plans to overcome those obstacles soon.  States that it may be a little bit more swollen today than it has been just because of the exercise of the strength that she has been putting on it recently.  Objective: Vital signs stable alert oriented x 3.  Right foot demonstrates minimal edema no erythema cellulitis drainage or odor incision site is gone on to heal uneventfully.  Still small amount of allodynic pain along the incision site good range of motion of the first metatarsophalangeal joint though is limited on dorsiflexion and plantarflexion.  Radiographic they demonstrate osseously mature foot.  Well-healing capital osteotomy of the first metatarsophalangeal joint with screw fixation intact and in good position right.  Assessment: Well-healing surgical foot.  Plan: Will allow her to get back to her full duty and follow-up with me on an as-needed basis.  Assessment

## 2023-05-27 ENCOUNTER — Ambulatory Visit: Payer: Self-pay

## 2023-05-27 ENCOUNTER — Encounter: Payer: Self-pay | Admitting: Physician Assistant

## 2023-05-27 DIAGNOSIS — Z0000 Encounter for general adult medical examination without abnormal findings: Secondary | ICD-10-CM

## 2023-05-27 LAB — POCT URINALYSIS DIPSTICK
Bilirubin, UA: NEGATIVE
Glucose, UA: NEGATIVE
Ketones, UA: NEGATIVE
Leukocytes, UA: NEGATIVE
Nitrite, UA: NEGATIVE
Protein, UA: NEGATIVE
Spec Grav, UA: 1.015 (ref 1.010–1.025)
Urobilinogen, UA: 0.2 U/dL
pH, UA: 6 (ref 5.0–8.0)

## 2023-05-28 LAB — CMP12+LP+TP+TSH+6AC+CBC/D/PLT
ALT: 16 IU/L (ref 0–32)
AST: 21 IU/L (ref 0–40)
Albumin: 4.5 g/dL (ref 3.9–4.9)
Alkaline Phosphatase: 43 IU/L — ABNORMAL LOW (ref 44–121)
BUN/Creatinine Ratio: 18 (ref 9–23)
BUN: 14 mg/dL (ref 6–24)
Basophils Absolute: 0 10*3/uL (ref 0.0–0.2)
Basos: 1 %
Bilirubin Total: 0.8 mg/dL (ref 0.0–1.2)
Calcium: 9.2 mg/dL (ref 8.7–10.2)
Chloride: 101 mmol/L (ref 96–106)
Chol/HDL Ratio: 4 ratio (ref 0.0–4.4)
Cholesterol, Total: 217 mg/dL — ABNORMAL HIGH (ref 100–199)
Creatinine, Ser: 0.79 mg/dL (ref 0.57–1.00)
EOS (ABSOLUTE): 0.1 10*3/uL (ref 0.0–0.4)
Eos: 2 %
Estimated CHD Risk: 0.9 times avg. (ref 0.0–1.0)
Free Thyroxine Index: 1.9 (ref 1.2–4.9)
GGT: 16 IU/L (ref 0–60)
Globulin, Total: 2.3 g/dL (ref 1.5–4.5)
Glucose: 91 mg/dL (ref 70–99)
HDL: 54 mg/dL (ref >39)
Hematocrit: 42.4 % (ref 34.0–46.6)
Hemoglobin: 14.2 g/dL (ref 11.1–15.9)
Immature Grans (Abs): 0 10*3/uL (ref 0.0–0.1)
Immature Granulocytes: 0 %
Iron: 109 ug/dL (ref 27–159)
LDH: 158 IU/L (ref 119–226)
LDL Chol Calc (NIH): 145 mg/dL — ABNORMAL HIGH (ref 0–99)
Lymphocytes Absolute: 1.7 10*3/uL (ref 0.7–3.1)
Lymphs: 40 %
MCH: 30.9 pg (ref 26.6–33.0)
MCHC: 33.5 g/dL (ref 31.5–35.7)
MCV: 92 fL (ref 79–97)
Monocytes Absolute: 0.4 10*3/uL (ref 0.1–0.9)
Monocytes: 10 %
Neutrophils Absolute: 2 10*3/uL (ref 1.4–7.0)
Neutrophils: 47 %
Phosphorus: 3.6 mg/dL (ref 3.0–4.3)
Platelets: 319 10*3/uL (ref 150–450)
Potassium: 4.2 mmol/L (ref 3.5–5.2)
RBC: 4.6 x10E6/uL (ref 3.77–5.28)
RDW: 11.9 % (ref 11.7–15.4)
Sodium: 137 mmol/L (ref 134–144)
T3 Uptake Ratio: 28 % (ref 24–39)
T4, Total: 6.7 ug/dL (ref 4.5–12.0)
TSH: 2.4 u[IU]/mL (ref 0.450–4.500)
Total Protein: 6.8 g/dL (ref 6.0–8.5)
Triglycerides: 102 mg/dL (ref 0–149)
Uric Acid: 6 mg/dL (ref 2.6–6.2)
VLDL Cholesterol Cal: 18 mg/dL (ref 5–40)
WBC: 4.3 10*3/uL (ref 3.4–10.8)
eGFR: 93 mL/min/{1.73_m2} (ref >59)

## 2023-05-29 ENCOUNTER — Encounter: Payer: Self-pay | Admitting: Physician Assistant

## 2023-06-03 ENCOUNTER — Encounter: Payer: Self-pay | Admitting: Physician Assistant

## 2023-06-03 ENCOUNTER — Ambulatory Visit: Payer: Self-pay | Admitting: Physician Assistant

## 2023-06-03 VITALS — BP 103/78 | HR 69 | Resp 12 | Ht 67.0 in | Wt 127.0 lb

## 2023-06-03 DIAGNOSIS — Z0000 Encounter for general adult medical examination without abnormal findings: Secondary | ICD-10-CM

## 2023-06-03 NOTE — Progress Notes (Signed)
 Pt presents today to complete physical, also  Pt requesting work note to end light duty. Dr Lara Plants cleared her for full duty 05/07/23 post surgery.

## 2023-06-03 NOTE — Progress Notes (Signed)
 [facility]   None    (approximate)  I have reviewed the triage vital signs and the nursing notes.   HISTORY  Chief Complaint Annual Exam   HPI Margaret Rosario is a 48 y.o. female patient presents for annual physical exam.  Patient is status post follow-up for bunion repair right foot.  Patient was released back to full duty on fourth 03/2023.  Advised to follow-up orthopedic surgeon as needed.         Past Medical History:  Diagnosis Date   AMA (advanced maternal age) multigravida 35+     Patient Active Problem List   Diagnosis Date Noted   Streptococcal sore throat 04/08/2022   Body aches 04/08/2022   Pregnancy 04/19/2013   Fetal microcephaly affecting antepartum care of mother 03/01/2013   Advanced maternal age, primigravida, antepartum 08/20/2012    Past Surgical History:  Procedure Laterality Date   FEMORAL HERNIA REPAIR     KNEE ARTHROSCOPY Left    ORIF FINGER / THUMB FRACTURE      Prior to Admission medications   Medication Sig Start Date End Date Taking? Authorizing Provider  acyclovir  (ZOVIRAX ) 400 MG tablet Take 1 tablet (400 mg total) by mouth 5 (five) times daily. 02/26/23  Yes Marcina Severe, PA-C    Allergies Patient has no known allergies.  Family History  Problem Relation Age of Onset   Cancer Maternal Grandfather        lung   Graves' disease Brother    Breast cancer Neg Hx     Social History Social History   Tobacco Use   Smoking status: Never   Smokeless tobacco: Never  Substance Use Topics   Alcohol use: Yes    Alcohol/week: 3.0 standard drinks of alcohol    Types: 3 Cans of beer per week    Comment: socially   Drug use: No    Review of Systems Constitutional: No fever/chills Eyes: No visual changes. ENT: No sore throat. Cardiovascular: Denies chest pain. Respiratory: Denies shortness of breath. Gastrointestinal: No abdominal pain.  No nausea, no vomiting.  No diarrhea.  No  constipation. Genitourinary: Negative for dysuria. Musculoskeletal: Negative for back pain. Skin: Negative for rash. Neurological: Negative for headaches, focal weakness or numbness. ____________________________________________   PHYSICAL EXAM:  VITAL SIGNS: BP 103/78  Cuff Size Normal  Pulse Rate 69  Weight 127 lb (57.6 kg)  Height 5\' 7"  (1.702 m)  Resp 12  SpO2 100 %   BMI: 19.89 kg/m2  BSA: 1.65 m2   Constitutional: Alert and oriented. Well appearing and in no acute distress. Eyes: Conjunctivae are normal. PERRL. EOMI. Head: Atraumatic. Nose: No congestion/rhinnorhea. Mouth/Throat: Mucous membranes are moist.  Oropharynx non-erythematous. Neck: No stridor.  No cervical spine tenderness to palpation. Hematological/Lymphatic/Immunilogical: No cervical lymphadenopathy. Cardiovascular: Normal rate, regular rhythm. Grossly normal heart sounds.  Good peripheral circulation. Respiratory: Normal respiratory effort.  No retractions. Lungs CTAB. Gastrointestinal: Soft and nontender. No distention. No abdominal bruits. No CVA tenderness. Genitourinary: Deferred Musculoskeletal: No lower extremity tenderness nor edema.  No joint effusions. Neurologic:  Normal speech and language. No gross focal neurologic deficits are appreciated. No gait instability. Skin:  Skin is warm, dry and intact. No rash noted. Psychiatric: Mood and affect are normal. Speech and behavior are normal.  ____________________________________________   LABS  _        Component Ref Range & Units (hover) 7 d ago 11 mo ago 1 yr ago 2 yr ago 4  yr ago  Color, UA yellow Pale Yellow yellow Light Yellow yellow  Clarity, UA clear Clear clear Clear clear  Glucose, UA Negative Negative Negative Negative Negative  Bilirubin, UA neg Negative negative Negative negative  Ketones, UA neg Negative negative Negative negative  Spec Grav, UA 1.015 1.015 >=1.030 Abnormal  1.020 1.025  Blood, UA trace -+ Negative negative  Negative negative  pH, UA 6.0 6.0 6.0 6.5 6.0  Protein, UA Negative Negative Negative Negative Negative  Urobilinogen, UA 0.2 0.2 0.2 0.2 0.2  Nitrite, UA neg Negative negative Negative negative  Leukocytes, UA Negative Trace Abnormal  Negative Negative Negative  Appearance       Odor                  View All Conversations on this Encounter               Component Ref Range & Units (hover) 7 d ago (05/27/23) 11 mo ago (06/20/22) 1 yr ago (07/25/21) 2 yr ago (06/16/20) 4 yr ago (03/17/19) 10 yr ago (04/19/13) 10 yr ago (01/07/13)  Glucose 91 86 88 99 R 98 R    Uric Acid 6.0 6.6 High  CM 7.2 High  CM 6.3 High  CM 6.2 CM    Comment:            Therapeutic target for gout patients: <6.0  BUN 14 17 14 15 13     Creatinine, Ser 0.79 0.84 0.74 0.88 0.88    eGFR 93 87 102 84     BUN/Creatinine Ratio 18 20 19 17 15     Sodium 137 135 141 141 142    Potassium 4.2 4.1 4.3 4.3 4.4    Chloride 101 101 103 103 106    Calcium 9.2 9.3 9.2 9.3 9.1    Phosphorus 3.6 2.9 Low  3.7 2.9 Low  3.3    Total Protein 6.8 7.1 6.6 7.2 6.6    Albumin 4.5 4.5 4.3 R 4.5 R 4.2 R    Globulin, Total 2.3 2.6 2.3 2.7 2.4    Bilirubin Total 0.8 1.0 0.7 0.6 0.7    Alkaline Phosphatase 43 Low  37 Low  41 Low  35 Low  39 R    LDH 158 151 145 149 189    AST 21 18 20 21 23     ALT 16 19 22 24 22     GGT 16 20 19 19 24     Iron 109 160 High  110 87 79    Cholesterol, Total 217 High  201 High  204 High  182 180    Triglycerides 102 84 82 57 53    HDL 54 57 60 60 50    VLDL Cholesterol Cal 18 15 15 11 10     LDL Chol Calc (NIH) 145 High  129 High  129 High  111 High  120 High     Chol/HDL Ratio 4.0 3.5 CM 3.4 CM 3.0 CM 3.6 CM    Comment:                                   T. Chol/HDL Ratio                                             Men  Women  1/2 Avg.Risk  3.4    3.3                                   Avg.Risk  5.0    4.4                                2X Avg.Risk  9.6    7.1                                 3X Avg.Risk 23.4   11.0  Estimated CHD Risk 0.9 0.6 CM 0.5 CM  < 0.5 CM 0.6 CM    Comment: The CHD Risk is based on the T. Chol/HDL ratio. Other factors affect CHD Risk such as hypertension, smoking, diabetes, severe obesity, and family history of premature CHD.  TSH 2.400 1.430 2.380 2.190 2.370    T4, Total 6.7 7.8 6.5 5.9 6.7    T3 Uptake Ratio 28 30 31 27 29     Free Thyroxine Index 1.9 2.3 2.0 1.6 1.9    WBC 4.3 5.3 4.2 4.6 4.4 5.7 R 6.6 R  RBC 4.60 4.56 4.72 4.89 4.53 3.87 R 3.53 Low  R  Hemoglobin 14.2 13.8 14.5 14.6 14.1 12.3 R 10.9 Low  R  Hematocrit 42.4 40.7 42.6 44.9 40.4 34.5 Low  R 31.1 Low  R  MCV 92 89 90 92 89 89.1 R 88.1 R  MCH 30.9 30.3 30.7 29.9 31.1 31.8 R 30.9 R  MCHC 33.5 33.9 34.0 32.5 34.9 35.7 R 35.0 R  RDW 11.9 12.1 11.7 11.8 11.7 12.8 R 13.8 R  Platelets 319 272 272 267 295 160 R 244 R  Neutrophils 47 58 46 52 42    Lymphs 40 33 40 35 43    Monocytes 10 8 11 10 11     Eos 2 0 2 2 3     Basos 1 1 1 1 1     Neutrophils Absolute 2.0 3.1 1.9 2.4 1.9    Lymphocytes Absolute 1.7 1.8 1.7 1.6 1.9    Monocytes Absolute 0.4 0.4 0.5 0.5 0.5    EOS (ABSOLUTE) 0.1 0.0 0.1 0.1 0.1    Basophils Absolute 0.0 0.0 0.0 0.1 0.0    Immature Granulocytes 0 0 0 0 0    Immature Grans (Abs) 0.0 0.0 0.0 0.0 0.0               ___________________________________________  EKG  Sinus rhythm at 64 bpm ____________________________________________    ____________________________________________   INITIAL IMPRESSION / ASSESSMENT AND PLAN  As part of my medical decision making, I reviewed the following data within the electronic MEDICAL RECORD NUMBER Notes from prior ED visits and [place] Controlled Substance Database             ____________________________________________   FINAL CLINICAL IMPRESSION  Well exam  ED Discharge Orders     None        Note:  This document was prepared using Dragon voice recognition software and may include  unintentional dictation errors.  Thank

## 2023-07-14 ENCOUNTER — Other Ambulatory Visit: Payer: Self-pay

## 2023-07-14 DIAGNOSIS — B001 Herpesviral vesicular dermatitis: Secondary | ICD-10-CM

## 2023-07-14 MED ORDER — ACYCLOVIR 400 MG PO TABS: 400.0000 mg | ORAL_TABLET | Freq: Every day | ORAL | 2 refills | Status: AC

## 2023-08-14 ENCOUNTER — Other Ambulatory Visit: Payer: Self-pay | Admitting: Physician Assistant

## 2023-08-14 DIAGNOSIS — Z1231 Encounter for screening mammogram for malignant neoplasm of breast: Secondary | ICD-10-CM

## 2023-08-29 ENCOUNTER — Ambulatory Visit
Admission: RE | Admit: 2023-08-29 | Discharge: 2023-08-29 | Disposition: A | Discharge status: Home / Self Care | Source: Ambulatory Visit | Attending: Physician Assistant | Admitting: Physician Assistant

## 2023-08-29 DIAGNOSIS — Z1231 Encounter for screening mammogram for malignant neoplasm of breast: Secondary | ICD-10-CM
